# Patient Record
Sex: Female | Born: 1950 | Race: Black or African American | Hispanic: No | Smoking: Never smoker
Health system: Southern US, Community
[De-identification: ages and names within clinical notes are randomized; demographics above are authoritative.]

## PROBLEM LIST (undated history)

## (undated) DIAGNOSIS — I10 Essential (primary) hypertension: Secondary | ICD-10-CM

## (undated) DIAGNOSIS — E079 Disorder of thyroid, unspecified: Secondary | ICD-10-CM

## (undated) DIAGNOSIS — F32A Depression, unspecified: Secondary | ICD-10-CM

## (undated) DIAGNOSIS — F329 Major depressive disorder, single episode, unspecified: Secondary | ICD-10-CM

## (undated) HISTORY — PX: TONSILLECTOMY: SUR1361

---

## 1998-11-19 ENCOUNTER — Emergency Department (HOSPITAL_COMMUNITY): Admission: EM | Admit: 1998-11-19 | Discharge: 1998-11-19 | Payer: Self-pay | Admitting: Emergency Medicine

## 1998-11-29 ENCOUNTER — Emergency Department (HOSPITAL_COMMUNITY): Admission: EM | Admit: 1998-11-29 | Discharge: 1998-11-29 | Payer: Self-pay | Admitting: Emergency Medicine

## 1998-12-08 ENCOUNTER — Encounter: Admission: RE | Admit: 1998-12-08 | Discharge: 1999-03-08 | Payer: Self-pay | Admitting: *Deleted

## 1999-11-17 ENCOUNTER — Other Ambulatory Visit: Admission: RE | Admit: 1999-11-17 | Discharge: 1999-11-17 | Payer: Self-pay | Admitting: Obstetrics & Gynecology

## 1999-11-17 ENCOUNTER — Encounter (INDEPENDENT_AMBULATORY_CARE_PROVIDER_SITE_OTHER): Payer: Self-pay

## 2001-01-03 ENCOUNTER — Other Ambulatory Visit: Admission: RE | Admit: 2001-01-03 | Discharge: 2001-01-03 | Payer: Self-pay | Admitting: Obstetrics & Gynecology

## 2001-02-18 ENCOUNTER — Encounter: Admission: RE | Admit: 2001-02-18 | Discharge: 2001-02-18 | Payer: Self-pay | Admitting: Internal Medicine

## 2001-02-18 ENCOUNTER — Encounter: Payer: Self-pay | Admitting: Internal Medicine

## 2001-05-26 ENCOUNTER — Encounter: Payer: Self-pay | Admitting: Internal Medicine

## 2001-05-26 ENCOUNTER — Encounter: Admission: RE | Admit: 2001-05-26 | Discharge: 2001-05-26 | Payer: Self-pay | Admitting: Internal Medicine

## 2001-05-30 ENCOUNTER — Encounter: Payer: Self-pay | Admitting: Internal Medicine

## 2001-05-30 ENCOUNTER — Ambulatory Visit (HOSPITAL_COMMUNITY): Admission: RE | Admit: 2001-05-30 | Discharge: 2001-05-30 | Payer: Self-pay | Admitting: Internal Medicine

## 2001-06-02 ENCOUNTER — Encounter: Payer: Self-pay | Admitting: *Deleted

## 2001-06-02 ENCOUNTER — Ambulatory Visit (HOSPITAL_COMMUNITY): Admission: RE | Admit: 2001-06-02 | Discharge: 2001-06-02 | Payer: Self-pay | Admitting: *Deleted

## 2003-12-31 ENCOUNTER — Other Ambulatory Visit: Admission: RE | Admit: 2003-12-31 | Discharge: 2003-12-31 | Payer: Self-pay | Admitting: Obstetrics & Gynecology

## 2005-10-06 ENCOUNTER — Emergency Department (HOSPITAL_COMMUNITY): Admission: EM | Admit: 2005-10-06 | Discharge: 2005-10-06 | Payer: Self-pay | Admitting: Emergency Medicine

## 2005-10-08 ENCOUNTER — Ambulatory Visit (HOSPITAL_COMMUNITY): Admission: RE | Admit: 2005-10-08 | Discharge: 2005-10-08 | Payer: Self-pay | Admitting: Internal Medicine

## 2006-03-25 ENCOUNTER — Encounter: Admission: RE | Admit: 2006-03-25 | Discharge: 2006-03-25 | Payer: Self-pay | Admitting: Internal Medicine

## 2007-01-29 ENCOUNTER — Encounter: Admission: RE | Admit: 2007-01-29 | Discharge: 2007-01-29 | Payer: Self-pay | Admitting: Internal Medicine

## 2007-04-30 ENCOUNTER — Encounter: Admission: RE | Admit: 2007-04-30 | Discharge: 2007-07-09 | Payer: Self-pay | Admitting: Orthopedic Surgery

## 2007-07-30 ENCOUNTER — Emergency Department (HOSPITAL_COMMUNITY): Admission: EM | Admit: 2007-07-30 | Discharge: 2007-07-30 | Payer: Self-pay | Admitting: Emergency Medicine

## 2007-08-19 ENCOUNTER — Encounter: Admission: RE | Admit: 2007-08-19 | Discharge: 2007-10-01 | Payer: Self-pay | Admitting: Orthopedic Surgery

## 2007-11-12 ENCOUNTER — Ambulatory Visit (HOSPITAL_COMMUNITY): Admission: RE | Admit: 2007-11-12 | Discharge: 2007-11-12 | Payer: Self-pay | Admitting: Obstetrics & Gynecology

## 2007-11-12 ENCOUNTER — Encounter (INDEPENDENT_AMBULATORY_CARE_PROVIDER_SITE_OTHER): Payer: Self-pay | Admitting: Obstetrics & Gynecology

## 2008-07-09 ENCOUNTER — Encounter: Admission: RE | Admit: 2008-07-09 | Discharge: 2008-07-09 | Payer: Self-pay | Admitting: Internal Medicine

## 2010-05-23 ENCOUNTER — Emergency Department (HOSPITAL_COMMUNITY)
Admission: EM | Admit: 2010-05-23 | Discharge: 2010-05-24 | Payer: Self-pay | Source: Home / Self Care | Admitting: Emergency Medicine

## 2010-10-17 NOTE — Op Note (Signed)
NAME:  Shelby, Howell NO.:  [id number]   MEDICAL RECORD NO.:  [id number]          PATIENT TYPE:  AMB   LOCATION:  SDC                           FACILITY:  [facility]   PHYSICIAN:  Ilda Mori, M.D.   DATE OF BIRTH:  01/23/1951   DATE OF PROCEDURE:  11/12/2007  DATE OF DISCHARGE:                               OPERATIVE REPORT   PREOPERATIVE DIAGNOSES:  1. Postmenopausal bleeding.  2. Endometrial polyps.   POSTOPERATIVE DIAGNOSES:  1. Postmenopausal bleeding.  2. Endometrial polyps.   PROCEDURE:  1. Hysteroscopy.  2. Dilation and curettage.  3. Endometrial polypectomy.   SURGEON:  Duke Salvia. Arlyce Dice, MD   ANESTHESIA:  General.   ESTIMATED BLOOD LOSS:  Minimal.   FINDINGS:  Three 2-cm endometrial polyps.  The uterine cavity otherwise  appeared normal.   SPECIMENS:  Sent to pathology.   INDICATIONS:  This is a 60 year old female who has had several episodes  of postmenopausal bleeding.  Office endometrial aspiration revealed  benign tissue with fragments of endometrial polyps.  The patient had  further episodes of bleeding and a sonohysterogram confirmed the  presence of endometrial polyps.  This was discussed with the patient and  a decision was made to remove them in the operating room.   PROCEDURE:  The patient was taken to the operating room and general  anesthesia was induced.  She was placed in a dorsal lithotomy position  and the vagina and perineum were prepped and draped in sterile fashion.  The anterior lip of the cervix was grasped with a single-tooth tenaculum  and the internal os was dilated with Pratt dilators to a 23-French.  A  diagnostic hysteroscope was introduced in the endometrial cavity and the  polyps were identified.  The hysteroscope was then removed and using a  polyp forceps, the polyps were then removed.  The hysteroscope was  replaced and a small seedling myoma was noted.  The operative  hysteroscope was then introduced after  the cervix  was dilated to 29-French and a cautery was used to remove the seedling  myoma .  At this point, the cavity was thoroughly inspected and no  further polyps remained and the procedure was terminated.  The patient  tolerated the procedure well and left the operating room in good  condition.      Ilda Mori, M.D.  Electronically Signed     RK/MEDQ  D:  11/12/2007  T:  11/13/2007  Job:  [id number]

## 2011-03-01 LAB — CBC
HCT: 36
Platelets: 232
RDW: 14.6
WBC: 6.1

## 2011-03-01 LAB — BASIC METABOLIC PANEL
BUN: 9
GFR calc Af Amer: >60
GFR calc non Af Amer: >60
Potassium: 3.5

## 2011-04-12 ENCOUNTER — Ambulatory Visit: Payer: Medicare Other | Attending: Orthopedic Surgery | Admitting: Occupational Therapy

## 2011-04-12 DIAGNOSIS — IMO0001 Reserved for inherently not codable concepts without codable children: Secondary | ICD-10-CM | POA: Insufficient documentation

## 2011-04-12 DIAGNOSIS — M24549 Contracture, unspecified hand: Secondary | ICD-10-CM | POA: Insufficient documentation

## 2011-04-18 ENCOUNTER — Encounter: Payer: Medicare Other | Admitting: Occupational Therapy

## 2011-05-04 ENCOUNTER — Encounter: Payer: Medicare Other | Admitting: *Deleted

## 2011-07-10 ENCOUNTER — Ambulatory Visit: Payer: Medicare Other | Attending: Orthopedic Surgery | Admitting: Occupational Therapy

## 2011-07-10 DIAGNOSIS — M24549 Contracture, unspecified hand: Secondary | ICD-10-CM | POA: Insufficient documentation

## 2011-07-10 DIAGNOSIS — IMO0001 Reserved for inherently not codable concepts without codable children: Secondary | ICD-10-CM | POA: Insufficient documentation

## 2011-07-24 ENCOUNTER — Encounter: Payer: Medicare Other | Admitting: Occupational Therapy

## 2011-08-07 ENCOUNTER — Encounter: Payer: Medicare Other | Admitting: Occupational Therapy

## 2012-12-29 ENCOUNTER — Other Ambulatory Visit: Payer: Self-pay

## 2014-11-19 ENCOUNTER — Emergency Department (HOSPITAL_COMMUNITY)
Admission: EM | Admit: 2014-11-19 | Discharge: 2014-11-19 | Disposition: A | Discharge status: Home / Self Care | Payer: Medicare Other | Attending: Emergency Medicine | Admitting: Emergency Medicine

## 2014-11-19 ENCOUNTER — Encounter (HOSPITAL_COMMUNITY): Payer: Self-pay | Admitting: Family Medicine

## 2014-11-19 DIAGNOSIS — R0981 Nasal congestion: Secondary | ICD-10-CM | POA: Diagnosis not present

## 2014-11-19 DIAGNOSIS — Z880 Allergy status to penicillin: Secondary | ICD-10-CM | POA: Insufficient documentation

## 2014-11-19 DIAGNOSIS — F419 Anxiety disorder, unspecified: Secondary | ICD-10-CM | POA: Diagnosis not present

## 2014-11-19 DIAGNOSIS — I10 Essential (primary) hypertension: Secondary | ICD-10-CM

## 2014-11-19 DIAGNOSIS — Z8639 Personal history of other endocrine, nutritional and metabolic disease: Secondary | ICD-10-CM | POA: Insufficient documentation

## 2014-11-19 DIAGNOSIS — R110 Nausea: Secondary | ICD-10-CM | POA: Insufficient documentation

## 2014-11-19 DIAGNOSIS — R42 Dizziness and giddiness: Secondary | ICD-10-CM | POA: Diagnosis present

## 2014-11-19 HISTORY — DX: Disorder of thyroid, unspecified: E07.9

## 2014-11-19 HISTORY — DX: Depression, unspecified: F32.A

## 2014-11-19 HISTORY — DX: Major depressive disorder, single episode, unspecified: F32.9

## 2014-11-19 HISTORY — DX: Essential (primary) hypertension: I10

## 2014-11-19 LAB — BASIC METABOLIC PANEL
Anion gap: 7 (ref 5–15)
BUN: 8 mg/dL (ref 6–20)
CO2: 28 mmol/L (ref 22–32)
CREATININE: 0.76 mg/dL (ref 0.44–1.00)
Calcium: 10.1 mg/dL (ref 8.9–10.3)
Chloride: 104 mmol/L (ref 101–111)
GFR calc Af Amer: >60 mL/min (ref >60)
GLUCOSE: 95 mg/dL (ref 65–99)
POTASSIUM: 3.8 mmol/L (ref 3.5–5.1)
Sodium: 139 mmol/L (ref 135–145)

## 2014-11-19 LAB — CBC
HEMATOCRIT: 37.5 % (ref 36.0–46.0)
Hemoglobin: 12.2 g/dL (ref 12.0–15.0)
MCH: 26.8 pg (ref 26.0–34.0)
MCHC: 32.5 g/dL (ref 30.0–36.0)
MCV: 82.2 fL (ref 78.0–100.0)
PLATELETS: 239 10*3/uL (ref 150–400)
RBC: 4.56 MIL/uL (ref 3.87–5.11)
RDW: 15.1 % (ref 11.5–15.5)
WBC: 7 10*3/uL (ref 4.0–10.5)

## 2014-11-19 LAB — I-STAT TROPONIN, ED: TROPONIN I, POC: 0 ng/mL (ref 0.00–0.08)

## 2014-11-19 MED ORDER — FLUTICASONE PROPIONATE 50 MCG/ACT NA SUSP: 2.0000 | Freq: Every day | NASAL | Status: AC

## 2014-11-19 NOTE — Discharge Instructions (Signed)
Take flonase daily.   Take your atenolol as prescribed.   See your doctor.   Return to ER if you have worse dizziness, chest pain, abdominal pain.

## 2014-11-19 NOTE — ED Provider Notes (Signed)
CSN: [id number]     Arrival date & time 11/19/14  1823 History   First MD Initiated Contact with Patient 11/19/14 2022     Chief Complaint  Patient presents with  . Dizziness  . Nausea     (Consider location/radiation/quality/duration/timing/severity/associated sxs/prior Treatment) The history is provided by the patient.  Shelby Howell is a 64 y.o. female hx of HTN, depression, here presenting with nausea, dizziness. States that she has been having some nasal congestion. She is also taking care of her father who has similar symptoms. Denies any chest pain or shortness of breath or abdominal pain or vomiting or fevers. She states that her blood pressure has been elevated recently but she is taking her atenolol. Also feels dizzy. Denies passing out.    Past Medical History  Diagnosis Date  . Hypertension   . Depression   . Thyroid disease    Past Surgical History  Procedure Laterality Date  . Tonsillectomy     History reviewed. No pertinent family history. History  Substance Use Topics  . Smoking status: Never Smoker   . Smokeless tobacco: Not on file  . Alcohol Use: No   OB History    No data available     Review of Systems  Neurological: Positive for dizziness.  All other systems reviewed and are negative.     Allergies  Penicillins  Home Medications   Prior to Admission medications   Not on File   BP 159/85 mmHg  Pulse 61  Temp(Src) 98.4 F (36.9 C) (Oral)  Resp 17  SpO2 100% Physical Exam  Constitutional: She is oriented to person, place, and time.  Slightly anxious   HENT:  Head: Normocephalic.  Mouth/Throat: Oropharynx is clear and moist.  Eyes: Conjunctivae are normal. Pupils are equal, round, and reactive to light.  Neck: Normal range of motion. Neck supple.  Cardiovascular: Normal rate and normal heart sounds.   Pulmonary/Chest: Effort normal and breath sounds normal. No respiratory distress. She has no wheezes. She has no rales.  Abdominal:  Soft. Bowel sounds are normal. She exhibits no distension. There is no tenderness. There is no rebound.  Musculoskeletal: Normal range of motion. She exhibits no edema or tenderness.  Neurological: She is alert and oriented to person, place, and time. No cranial nerve deficit. Coordination normal.  Skin: Skin is warm and dry.  Psychiatric: She has a normal mood and affect. Her behavior is normal. Judgment and thought content normal.  Nursing note and vitals reviewed.   ED Course  Procedures (including critical care time) Labs Review Labs Reviewed  [place], ED    Imaging Review No results found.   EKG Interpretation   Date/Time:  Friday November 19 2014 18:31:41 EDT Ventricular Rate:  59 PR Interval:  164 QRS Duration: 80 QT Interval:  426 QTC Calculation: 421 R Axis:   31 Text Interpretation:  Sinus bradycardia Otherwise normal ECG No previous  ECGs available Confirmed by Wray Goehring  MD, Moises Terpstra ([id number]) on 11/19/2014 8:22:47 PM      MDM   Final diagnoses:  None   Shelby Howell is a 64 y.o. female here with dizziness, nasal congestion. Well appearing. BP 160s initially but dec to 140/80 while in the ED. No signs of hypertensive emergency. Will give flonase for nasal congestion. Stable for dc.     Wandra Arthurs, MD 11/19/14 2104

## 2014-11-19 NOTE — ED Notes (Signed)
Pt here for nausea, dizziness that started yesterday. sts BP has been elevated and some nasal drainage. Denies pain.

## 2014-11-29 DIAGNOSIS — I10 Essential (primary) hypertension: Secondary | ICD-10-CM | POA: Diagnosis not present

## 2014-11-29 DIAGNOSIS — E039 Hypothyroidism, unspecified: Secondary | ICD-10-CM | POA: Diagnosis not present

## 2014-11-29 DIAGNOSIS — E782 Mixed hyperlipidemia: Secondary | ICD-10-CM | POA: Diagnosis not present

## 2014-11-29 DIAGNOSIS — F209 Schizophrenia, unspecified: Secondary | ICD-10-CM | POA: Diagnosis not present

## 2015-02-28 DIAGNOSIS — I10 Essential (primary) hypertension: Secondary | ICD-10-CM | POA: Diagnosis not present

## 2015-03-15 DIAGNOSIS — H1045 Other chronic allergic conjunctivitis: Secondary | ICD-10-CM | POA: Diagnosis not present

## 2015-03-15 DIAGNOSIS — J3089 Other allergic rhinitis: Secondary | ICD-10-CM | POA: Diagnosis not present

## 2015-03-15 DIAGNOSIS — J3081 Allergic rhinitis due to animal (cat) (dog) hair and dander: Secondary | ICD-10-CM | POA: Diagnosis not present

## 2015-03-15 DIAGNOSIS — L509 Urticaria, unspecified: Secondary | ICD-10-CM | POA: Diagnosis not present

## 2015-03-15 DIAGNOSIS — Z23 Encounter for immunization: Secondary | ICD-10-CM | POA: Diagnosis not present

## 2015-05-17 DIAGNOSIS — H04123 Dry eye syndrome of bilateral lacrimal glands: Secondary | ICD-10-CM | POA: Diagnosis not present

## 2015-05-17 DIAGNOSIS — H25013 Cortical age-related cataract, bilateral: Secondary | ICD-10-CM | POA: Diagnosis not present

## 2015-05-17 DIAGNOSIS — H2513 Age-related nuclear cataract, bilateral: Secondary | ICD-10-CM | POA: Diagnosis not present

## 2015-05-17 DIAGNOSIS — H40023 Open angle with borderline findings, high risk, bilateral: Secondary | ICD-10-CM | POA: Diagnosis not present

## 2015-06-02 DIAGNOSIS — Z1389 Encounter for screening for other disorder: Secondary | ICD-10-CM | POA: Diagnosis not present

## 2015-06-02 DIAGNOSIS — Z0000 Encounter for general adult medical examination without abnormal findings: Secondary | ICD-10-CM | POA: Diagnosis not present

## 2015-06-02 DIAGNOSIS — E782 Mixed hyperlipidemia: Secondary | ICD-10-CM | POA: Diagnosis not present

## 2015-06-02 DIAGNOSIS — J309 Allergic rhinitis, unspecified: Secondary | ICD-10-CM | POA: Diagnosis not present

## 2015-06-02 DIAGNOSIS — F209 Schizophrenia, unspecified: Secondary | ICD-10-CM | POA: Diagnosis not present

## 2015-06-02 DIAGNOSIS — E039 Hypothyroidism, unspecified: Secondary | ICD-10-CM | POA: Diagnosis not present

## 2015-06-02 DIAGNOSIS — R7309 Other abnormal glucose: Secondary | ICD-10-CM | POA: Diagnosis not present

## 2015-06-02 DIAGNOSIS — Z23 Encounter for immunization: Secondary | ICD-10-CM | POA: Diagnosis not present

## 2015-06-02 DIAGNOSIS — I10 Essential (primary) hypertension: Secondary | ICD-10-CM | POA: Diagnosis not present

## 2015-08-15 DIAGNOSIS — Z803 Family history of malignant neoplasm of breast: Secondary | ICD-10-CM | POA: Diagnosis not present

## 2015-08-15 DIAGNOSIS — Z1231 Encounter for screening mammogram for malignant neoplasm of breast: Secondary | ICD-10-CM | POA: Diagnosis not present

## 2015-08-19 DIAGNOSIS — J309 Allergic rhinitis, unspecified: Secondary | ICD-10-CM | POA: Diagnosis not present

## 2015-08-19 DIAGNOSIS — R42 Dizziness and giddiness: Secondary | ICD-10-CM | POA: Diagnosis not present

## 2015-08-19 DIAGNOSIS — R350 Frequency of micturition: Secondary | ICD-10-CM | POA: Diagnosis not present

## 2015-09-30 ENCOUNTER — Ambulatory Visit
Admission: RE | Admit: 2015-09-30 | Discharge: 2015-09-30 | Disposition: A | Discharge status: Home / Self Care | Payer: Medicare Other | Source: Ambulatory Visit | Attending: Internal Medicine | Admitting: Internal Medicine

## 2015-09-30 ENCOUNTER — Other Ambulatory Visit: Payer: Self-pay | Admitting: Internal Medicine

## 2015-09-30 DIAGNOSIS — R1084 Generalized abdominal pain: Secondary | ICD-10-CM | POA: Diagnosis not present

## 2015-09-30 DIAGNOSIS — R197 Diarrhea, unspecified: Secondary | ICD-10-CM | POA: Diagnosis not present

## 2015-09-30 DIAGNOSIS — R112 Nausea with vomiting, unspecified: Secondary | ICD-10-CM

## 2015-10-04 ENCOUNTER — Other Ambulatory Visit: Payer: 59

## 2015-10-12 DIAGNOSIS — R112 Nausea with vomiting, unspecified: Secondary | ICD-10-CM | POA: Diagnosis not present

## 2015-10-12 DIAGNOSIS — R197 Diarrhea, unspecified: Secondary | ICD-10-CM | POA: Diagnosis not present

## 2015-10-26 ENCOUNTER — Other Ambulatory Visit: Payer: Self-pay | Admitting: Gastroenterology

## 2015-10-26 DIAGNOSIS — R110 Nausea: Secondary | ICD-10-CM

## 2015-11-01 ENCOUNTER — Ambulatory Visit
Admission: RE | Admit: 2015-11-01 | Discharge: 2015-11-01 | Disposition: A | Discharge status: Home / Self Care | Payer: Medicare Other | Source: Ambulatory Visit | Attending: Gastroenterology | Admitting: Gastroenterology

## 2015-11-01 ENCOUNTER — Other Ambulatory Visit: Payer: Self-pay | Admitting: Gastroenterology

## 2015-11-01 DIAGNOSIS — R110 Nausea: Secondary | ICD-10-CM

## 2015-11-07 ENCOUNTER — Ambulatory Visit
Admission: RE | Admit: 2015-11-07 | Discharge: 2015-11-07 | Disposition: A | Discharge status: Home / Self Care | Payer: Medicare Other | Source: Ambulatory Visit | Attending: Gastroenterology | Admitting: Gastroenterology

## 2015-11-07 DIAGNOSIS — R110 Nausea: Secondary | ICD-10-CM

## 2015-11-07 DIAGNOSIS — K449 Diaphragmatic hernia without obstruction or gangrene: Secondary | ICD-10-CM | POA: Diagnosis not present

## 2015-12-20 DIAGNOSIS — I10 Essential (primary) hypertension: Secondary | ICD-10-CM | POA: Diagnosis not present

## 2015-12-20 DIAGNOSIS — R7303 Prediabetes: Secondary | ICD-10-CM | POA: Diagnosis not present

## 2015-12-20 DIAGNOSIS — E039 Hypothyroidism, unspecified: Secondary | ICD-10-CM | POA: Diagnosis not present

## 2015-12-20 DIAGNOSIS — F209 Schizophrenia, unspecified: Secondary | ICD-10-CM | POA: Diagnosis not present

## 2015-12-20 DIAGNOSIS — K219 Gastro-esophageal reflux disease without esophagitis: Secondary | ICD-10-CM | POA: Diagnosis not present

## 2015-12-20 DIAGNOSIS — E782 Mixed hyperlipidemia: Secondary | ICD-10-CM | POA: Diagnosis not present

## 2016-02-08 ENCOUNTER — Other Ambulatory Visit: Payer: Self-pay | Admitting: Obstetrics & Gynecology

## 2016-02-08 DIAGNOSIS — Z124 Encounter for screening for malignant neoplasm of cervix: Secondary | ICD-10-CM | POA: Diagnosis not present

## 2016-02-09 LAB — CYTOLOGY - PAP

## 2016-02-21 DIAGNOSIS — Z23 Encounter for immunization: Secondary | ICD-10-CM | POA: Diagnosis not present

## 2016-03-23 DIAGNOSIS — J3081 Allergic rhinitis due to animal (cat) (dog) hair and dander: Secondary | ICD-10-CM | POA: Diagnosis not present

## 2016-03-23 DIAGNOSIS — J3089 Other allergic rhinitis: Secondary | ICD-10-CM | POA: Diagnosis not present

## 2016-03-23 DIAGNOSIS — H1045 Other chronic allergic conjunctivitis: Secondary | ICD-10-CM | POA: Diagnosis not present

## 2016-03-23 DIAGNOSIS — L509 Urticaria, unspecified: Secondary | ICD-10-CM | POA: Diagnosis not present

## 2016-03-27 DIAGNOSIS — R42 Dizziness and giddiness: Secondary | ICD-10-CM | POA: Diagnosis not present

## 2016-06-11 DIAGNOSIS — H2513 Age-related nuclear cataract, bilateral: Secondary | ICD-10-CM | POA: Diagnosis not present

## 2016-06-11 DIAGNOSIS — H25013 Cortical age-related cataract, bilateral: Secondary | ICD-10-CM | POA: Diagnosis not present

## 2016-06-11 DIAGNOSIS — H04123 Dry eye syndrome of bilateral lacrimal glands: Secondary | ICD-10-CM | POA: Diagnosis not present

## 2016-06-11 DIAGNOSIS — H40023 Open angle with borderline findings, high risk, bilateral: Secondary | ICD-10-CM | POA: Diagnosis not present

## 2016-06-26 DIAGNOSIS — R42 Dizziness and giddiness: Secondary | ICD-10-CM | POA: Diagnosis not present

## 2016-06-26 DIAGNOSIS — H6120 Impacted cerumen, unspecified ear: Secondary | ICD-10-CM | POA: Diagnosis not present

## 2016-06-26 DIAGNOSIS — H8110 Benign paroxysmal vertigo, unspecified ear: Secondary | ICD-10-CM | POA: Diagnosis not present

## 2016-06-29 ENCOUNTER — Other Ambulatory Visit: Payer: Self-pay | Admitting: Internal Medicine

## 2016-06-29 DIAGNOSIS — R42 Dizziness and giddiness: Secondary | ICD-10-CM

## 2016-07-02 ENCOUNTER — Ambulatory Visit
Admission: RE | Admit: 2016-07-02 | Discharge: 2016-07-02 | Disposition: A | Discharge status: Home / Self Care | Payer: Medicare Other | Source: Ambulatory Visit | Attending: Internal Medicine | Admitting: Internal Medicine

## 2016-07-02 DIAGNOSIS — H6123 Impacted cerumen, bilateral: Secondary | ICD-10-CM | POA: Diagnosis not present

## 2016-07-02 DIAGNOSIS — R51 Headache: Secondary | ICD-10-CM | POA: Diagnosis not present

## 2016-07-02 DIAGNOSIS — R42 Dizziness and giddiness: Secondary | ICD-10-CM

## 2016-07-04 DIAGNOSIS — Z0000 Encounter for general adult medical examination without abnormal findings: Secondary | ICD-10-CM | POA: Diagnosis not present

## 2016-07-04 DIAGNOSIS — K219 Gastro-esophageal reflux disease without esophagitis: Secondary | ICD-10-CM | POA: Diagnosis not present

## 2016-07-04 DIAGNOSIS — Z1389 Encounter for screening for other disorder: Secondary | ICD-10-CM | POA: Diagnosis not present

## 2016-07-04 DIAGNOSIS — F209 Schizophrenia, unspecified: Secondary | ICD-10-CM | POA: Diagnosis not present

## 2016-07-04 DIAGNOSIS — J309 Allergic rhinitis, unspecified: Secondary | ICD-10-CM | POA: Diagnosis not present

## 2016-07-04 DIAGNOSIS — Z7189 Other specified counseling: Secondary | ICD-10-CM | POA: Diagnosis not present

## 2016-07-04 DIAGNOSIS — E039 Hypothyroidism, unspecified: Secondary | ICD-10-CM | POA: Diagnosis not present

## 2016-07-04 DIAGNOSIS — Z1211 Encounter for screening for malignant neoplasm of colon: Secondary | ICD-10-CM | POA: Diagnosis not present

## 2016-07-04 DIAGNOSIS — E782 Mixed hyperlipidemia: Secondary | ICD-10-CM | POA: Diagnosis not present

## 2016-07-04 DIAGNOSIS — M5090 Cervical disc disorder, unspecified, unspecified cervical region: Secondary | ICD-10-CM | POA: Diagnosis not present

## 2016-07-04 DIAGNOSIS — R7303 Prediabetes: Secondary | ICD-10-CM | POA: Diagnosis not present

## 2016-07-04 DIAGNOSIS — I10 Essential (primary) hypertension: Secondary | ICD-10-CM | POA: Diagnosis not present

## 2016-08-06 DIAGNOSIS — M810 Age-related osteoporosis without current pathological fracture: Secondary | ICD-10-CM | POA: Diagnosis not present

## 2016-08-20 DIAGNOSIS — Z1231 Encounter for screening mammogram for malignant neoplasm of breast: Secondary | ICD-10-CM | POA: Diagnosis not present

## 2016-08-20 DIAGNOSIS — Z803 Family history of malignant neoplasm of breast: Secondary | ICD-10-CM | POA: Diagnosis not present

## 2016-08-27 DIAGNOSIS — R197 Diarrhea, unspecified: Secondary | ICD-10-CM | POA: Diagnosis not present

## 2016-08-27 DIAGNOSIS — K219 Gastro-esophageal reflux disease without esophagitis: Secondary | ICD-10-CM | POA: Diagnosis not present

## 2016-08-27 DIAGNOSIS — R112 Nausea with vomiting, unspecified: Secondary | ICD-10-CM | POA: Diagnosis not present

## 2016-08-29 ENCOUNTER — Other Ambulatory Visit: Payer: Self-pay | Admitting: Gastroenterology

## 2016-11-05 ENCOUNTER — Encounter (HOSPITAL_COMMUNITY): Payer: Self-pay

## 2016-11-05 ENCOUNTER — Ambulatory Visit (HOSPITAL_COMMUNITY): Admit: 2016-11-05 | Payer: Medicare Other | Admitting: Gastroenterology

## 2016-11-05 SURGERY — COLONOSCOPY WITH PROPOFOL
Anesthesia: Monitor Anesthesia Care

## 2016-12-17 DIAGNOSIS — H40023 Open angle with borderline findings, high risk, bilateral: Secondary | ICD-10-CM | POA: Diagnosis not present

## 2016-12-17 DIAGNOSIS — H04123 Dry eye syndrome of bilateral lacrimal glands: Secondary | ICD-10-CM | POA: Diagnosis not present

## 2017-01-01 ENCOUNTER — Other Ambulatory Visit: Payer: Self-pay | Admitting: Gastroenterology

## 2017-01-01 DIAGNOSIS — F209 Schizophrenia, unspecified: Secondary | ICD-10-CM | POA: Diagnosis not present

## 2017-01-01 DIAGNOSIS — M5090 Cervical disc disorder, unspecified, unspecified cervical region: Secondary | ICD-10-CM | POA: Diagnosis not present

## 2017-01-01 DIAGNOSIS — E039 Hypothyroidism, unspecified: Secondary | ICD-10-CM | POA: Diagnosis not present

## 2017-01-01 DIAGNOSIS — Z800 Family history of malignant neoplasm of digestive organs: Secondary | ICD-10-CM

## 2017-01-01 DIAGNOSIS — R7303 Prediabetes: Secondary | ICD-10-CM | POA: Diagnosis not present

## 2017-01-01 DIAGNOSIS — E782 Mixed hyperlipidemia: Secondary | ICD-10-CM | POA: Diagnosis not present

## 2017-01-01 DIAGNOSIS — K219 Gastro-esophageal reflux disease without esophagitis: Secondary | ICD-10-CM | POA: Diagnosis not present

## 2017-01-01 DIAGNOSIS — I10 Essential (primary) hypertension: Secondary | ICD-10-CM | POA: Diagnosis not present

## 2017-01-17 ENCOUNTER — Ambulatory Visit
Admission: RE | Admit: 2017-01-17 | Discharge: 2017-01-17 | Disposition: A | Discharge status: Home / Self Care | Payer: Medicare Other | Source: Ambulatory Visit | Attending: Gastroenterology | Admitting: Gastroenterology

## 2017-01-17 DIAGNOSIS — R932 Abnormal findings on diagnostic imaging of liver and biliary tract: Secondary | ICD-10-CM | POA: Diagnosis not present

## 2017-01-17 DIAGNOSIS — N281 Cyst of kidney, acquired: Secondary | ICD-10-CM | POA: Diagnosis not present

## 2017-01-17 DIAGNOSIS — Z800 Family history of malignant neoplasm of digestive organs: Secondary | ICD-10-CM

## 2017-01-17 MED ORDER — GADOBENATE DIMEGLUMINE 529 MG/ML IV SOLN
15.0000 mL | Freq: Once | INTRAVENOUS | Status: AC | PRN
  Administered 2017-01-17: 15 mL via INTRAVENOUS

## 2017-01-24 DIAGNOSIS — F250 Schizoaffective disorder, bipolar type: Secondary | ICD-10-CM | POA: Diagnosis not present

## 2017-01-31 DIAGNOSIS — K6389 Other specified diseases of intestine: Secondary | ICD-10-CM | POA: Diagnosis not present

## 2017-01-31 DIAGNOSIS — Q438 Other specified congenital malformations of intestine: Secondary | ICD-10-CM | POA: Diagnosis not present

## 2017-01-31 DIAGNOSIS — Z1211 Encounter for screening for malignant neoplasm of colon: Secondary | ICD-10-CM | POA: Diagnosis not present

## 2017-01-31 DIAGNOSIS — K5730 Diverticulosis of large intestine without perforation or abscess without bleeding: Secondary | ICD-10-CM | POA: Diagnosis not present

## 2017-01-31 DIAGNOSIS — K648 Other hemorrhoids: Secondary | ICD-10-CM | POA: Diagnosis not present

## 2017-01-31 DIAGNOSIS — K6289 Other specified diseases of anus and rectum: Secondary | ICD-10-CM | POA: Diagnosis not present

## 2017-02-05 DIAGNOSIS — K6389 Other specified diseases of intestine: Secondary | ICD-10-CM | POA: Diagnosis not present

## 2017-02-05 DIAGNOSIS — K6289 Other specified diseases of anus and rectum: Secondary | ICD-10-CM | POA: Diagnosis not present

## 2017-02-05 DIAGNOSIS — Z1211 Encounter for screening for malignant neoplasm of colon: Secondary | ICD-10-CM | POA: Diagnosis not present

## 2017-03-19 DIAGNOSIS — J3081 Allergic rhinitis due to animal (cat) (dog) hair and dander: Secondary | ICD-10-CM | POA: Diagnosis not present

## 2017-03-19 DIAGNOSIS — H1045 Other chronic allergic conjunctivitis: Secondary | ICD-10-CM | POA: Diagnosis not present

## 2017-03-19 DIAGNOSIS — J3089 Other allergic rhinitis: Secondary | ICD-10-CM | POA: Diagnosis not present

## 2017-03-19 DIAGNOSIS — L509 Urticaria, unspecified: Secondary | ICD-10-CM | POA: Diagnosis not present

## 2017-03-20 DIAGNOSIS — Z23 Encounter for immunization: Secondary | ICD-10-CM | POA: Diagnosis not present

## 2017-04-03 DIAGNOSIS — E039 Hypothyroidism, unspecified: Secondary | ICD-10-CM | POA: Diagnosis not present

## 2017-04-03 DIAGNOSIS — E782 Mixed hyperlipidemia: Secondary | ICD-10-CM | POA: Diagnosis not present

## 2017-04-03 DIAGNOSIS — I10 Essential (primary) hypertension: Secondary | ICD-10-CM | POA: Diagnosis not present

## 2017-04-08 DIAGNOSIS — M779 Enthesopathy, unspecified: Secondary | ICD-10-CM | POA: Diagnosis not present

## 2017-07-02 DIAGNOSIS — H25013 Cortical age-related cataract, bilateral: Secondary | ICD-10-CM | POA: Diagnosis not present

## 2017-07-02 DIAGNOSIS — H2513 Age-related nuclear cataract, bilateral: Secondary | ICD-10-CM | POA: Diagnosis not present

## 2017-07-02 DIAGNOSIS — H40023 Open angle with borderline findings, high risk, bilateral: Secondary | ICD-10-CM | POA: Diagnosis not present

## 2017-07-02 DIAGNOSIS — H04123 Dry eye syndrome of bilateral lacrimal glands: Secondary | ICD-10-CM | POA: Diagnosis not present

## 2017-08-01 DIAGNOSIS — E039 Hypothyroidism, unspecified: Secondary | ICD-10-CM | POA: Diagnosis not present

## 2017-08-01 DIAGNOSIS — I10 Essential (primary) hypertension: Secondary | ICD-10-CM | POA: Diagnosis not present

## 2017-08-01 DIAGNOSIS — E782 Mixed hyperlipidemia: Secondary | ICD-10-CM | POA: Diagnosis not present

## 2017-08-21 DIAGNOSIS — K219 Gastro-esophageal reflux disease without esophagitis: Secondary | ICD-10-CM | POA: Diagnosis not present

## 2017-08-21 DIAGNOSIS — Z1231 Encounter for screening mammogram for malignant neoplasm of breast: Secondary | ICD-10-CM | POA: Diagnosis not present

## 2017-08-21 DIAGNOSIS — Z800 Family history of malignant neoplasm of digestive organs: Secondary | ICD-10-CM | POA: Diagnosis not present

## 2017-08-21 DIAGNOSIS — Z803 Family history of malignant neoplasm of breast: Secondary | ICD-10-CM | POA: Diagnosis not present

## 2017-08-26 DIAGNOSIS — R739 Hyperglycemia, unspecified: Secondary | ICD-10-CM | POA: Diagnosis not present

## 2017-08-26 DIAGNOSIS — M5090 Cervical disc disorder, unspecified, unspecified cervical region: Secondary | ICD-10-CM | POA: Diagnosis not present

## 2017-08-26 DIAGNOSIS — Z1389 Encounter for screening for other disorder: Secondary | ICD-10-CM | POA: Diagnosis not present

## 2017-08-26 DIAGNOSIS — Z6828 Body mass index (BMI) 28.0-28.9, adult: Secondary | ICD-10-CM | POA: Diagnosis not present

## 2017-08-26 DIAGNOSIS — E782 Mixed hyperlipidemia: Secondary | ICD-10-CM | POA: Diagnosis not present

## 2017-08-26 DIAGNOSIS — E039 Hypothyroidism, unspecified: Secondary | ICD-10-CM | POA: Diagnosis not present

## 2017-08-26 DIAGNOSIS — I10 Essential (primary) hypertension: Secondary | ICD-10-CM | POA: Diagnosis not present

## 2017-08-26 DIAGNOSIS — F209 Schizophrenia, unspecified: Secondary | ICD-10-CM | POA: Diagnosis not present

## 2017-08-26 DIAGNOSIS — K219 Gastro-esophageal reflux disease without esophagitis: Secondary | ICD-10-CM | POA: Diagnosis not present

## 2017-08-26 DIAGNOSIS — Z0000 Encounter for general adult medical examination without abnormal findings: Secondary | ICD-10-CM | POA: Diagnosis not present

## 2017-08-26 DIAGNOSIS — J309 Allergic rhinitis, unspecified: Secondary | ICD-10-CM | POA: Diagnosis not present

## 2017-10-01 DIAGNOSIS — I10 Essential (primary) hypertension: Secondary | ICD-10-CM | POA: Diagnosis not present

## 2017-10-01 DIAGNOSIS — E039 Hypothyroidism, unspecified: Secondary | ICD-10-CM | POA: Diagnosis not present

## 2017-10-01 DIAGNOSIS — E782 Mixed hyperlipidemia: Secondary | ICD-10-CM | POA: Diagnosis not present

## 2017-12-24 DIAGNOSIS — E782 Mixed hyperlipidemia: Secondary | ICD-10-CM | POA: Diagnosis not present

## 2017-12-24 DIAGNOSIS — I10 Essential (primary) hypertension: Secondary | ICD-10-CM | POA: Diagnosis not present

## 2017-12-24 DIAGNOSIS — E039 Hypothyroidism, unspecified: Secondary | ICD-10-CM | POA: Diagnosis not present

## 2018-01-08 DIAGNOSIS — I10 Essential (primary) hypertension: Secondary | ICD-10-CM | POA: Diagnosis not present

## 2018-01-08 DIAGNOSIS — E039 Hypothyroidism, unspecified: Secondary | ICD-10-CM | POA: Diagnosis not present

## 2018-01-08 DIAGNOSIS — E782 Mixed hyperlipidemia: Secondary | ICD-10-CM | POA: Diagnosis not present

## 2018-01-18 ENCOUNTER — Other Ambulatory Visit: Payer: Self-pay | Admitting: Gastroenterology

## 2018-01-18 DIAGNOSIS — Z800 Family history of malignant neoplasm of digestive organs: Secondary | ICD-10-CM

## 2018-02-04 ENCOUNTER — Ambulatory Visit
Admission: RE | Admit: 2018-02-04 | Discharge: 2018-02-04 | Disposition: A | Discharge status: Home / Self Care | Payer: Medicare Other | Source: Ambulatory Visit | Attending: Gastroenterology | Admitting: Gastroenterology

## 2018-02-04 DIAGNOSIS — Z800 Family history of malignant neoplasm of digestive organs: Secondary | ICD-10-CM

## 2018-02-04 DIAGNOSIS — D1803 Hemangioma of intra-abdominal structures: Secondary | ICD-10-CM | POA: Diagnosis not present

## 2018-02-04 DIAGNOSIS — R932 Abnormal findings on diagnostic imaging of liver and biliary tract: Secondary | ICD-10-CM | POA: Diagnosis not present

## 2018-02-04 MED ORDER — GADOBENATE DIMEGLUMINE 529 MG/ML IV SOLN
15.0000 mL | Freq: Once | INTRAVENOUS | Status: AC | PRN
  Administered 2018-02-04: 15 mL via INTRAVENOUS

## 2018-02-19 DIAGNOSIS — Z01419 Encounter for gynecological examination (general) (routine) without abnormal findings: Secondary | ICD-10-CM | POA: Diagnosis not present

## 2018-02-24 DIAGNOSIS — E782 Mixed hyperlipidemia: Secondary | ICD-10-CM | POA: Diagnosis not present

## 2018-02-24 DIAGNOSIS — F209 Schizophrenia, unspecified: Secondary | ICD-10-CM | POA: Diagnosis not present

## 2018-02-24 DIAGNOSIS — J309 Allergic rhinitis, unspecified: Secondary | ICD-10-CM | POA: Diagnosis not present

## 2018-02-24 DIAGNOSIS — K219 Gastro-esophageal reflux disease without esophagitis: Secondary | ICD-10-CM | POA: Diagnosis not present

## 2018-02-24 DIAGNOSIS — E039 Hypothyroidism, unspecified: Secondary | ICD-10-CM | POA: Diagnosis not present

## 2018-02-24 DIAGNOSIS — I10 Essential (primary) hypertension: Secondary | ICD-10-CM | POA: Diagnosis not present

## 2018-02-24 DIAGNOSIS — Z23 Encounter for immunization: Secondary | ICD-10-CM | POA: Diagnosis not present

## 2018-02-24 DIAGNOSIS — R7303 Prediabetes: Secondary | ICD-10-CM | POA: Diagnosis not present

## 2018-03-18 DIAGNOSIS — H1045 Other chronic allergic conjunctivitis: Secondary | ICD-10-CM | POA: Diagnosis not present

## 2018-03-18 DIAGNOSIS — J3081 Allergic rhinitis due to animal (cat) (dog) hair and dander: Secondary | ICD-10-CM | POA: Diagnosis not present

## 2018-03-18 DIAGNOSIS — J3089 Other allergic rhinitis: Secondary | ICD-10-CM | POA: Diagnosis not present

## 2018-03-18 DIAGNOSIS — L509 Urticaria, unspecified: Secondary | ICD-10-CM | POA: Diagnosis not present

## 2018-04-02 DIAGNOSIS — F250 Schizoaffective disorder, bipolar type: Secondary | ICD-10-CM | POA: Diagnosis not present

## 2018-06-02 DIAGNOSIS — N762 Acute vulvitis: Secondary | ICD-10-CM | POA: Diagnosis not present

## 2018-07-08 DIAGNOSIS — H04123 Dry eye syndrome of bilateral lacrimal glands: Secondary | ICD-10-CM | POA: Diagnosis not present

## 2018-07-08 DIAGNOSIS — H25013 Cortical age-related cataract, bilateral: Secondary | ICD-10-CM | POA: Diagnosis not present

## 2018-07-08 DIAGNOSIS — H40023 Open angle with borderline findings, high risk, bilateral: Secondary | ICD-10-CM | POA: Diagnosis not present

## 2018-07-08 DIAGNOSIS — H2513 Age-related nuclear cataract, bilateral: Secondary | ICD-10-CM | POA: Diagnosis not present

## 2018-09-03 DIAGNOSIS — K219 Gastro-esophageal reflux disease without esophagitis: Secondary | ICD-10-CM | POA: Diagnosis not present

## 2018-09-03 DIAGNOSIS — R7303 Prediabetes: Secondary | ICD-10-CM | POA: Diagnosis not present

## 2018-09-03 DIAGNOSIS — Z0000 Encounter for general adult medical examination without abnormal findings: Secondary | ICD-10-CM | POA: Diagnosis not present

## 2018-09-03 DIAGNOSIS — F209 Schizophrenia, unspecified: Secondary | ICD-10-CM | POA: Diagnosis not present

## 2018-09-03 DIAGNOSIS — E039 Hypothyroidism, unspecified: Secondary | ICD-10-CM | POA: Diagnosis not present

## 2018-09-03 DIAGNOSIS — I10 Essential (primary) hypertension: Secondary | ICD-10-CM | POA: Diagnosis not present

## 2018-09-03 DIAGNOSIS — Z1389 Encounter for screening for other disorder: Secondary | ICD-10-CM | POA: Diagnosis not present

## 2018-09-03 DIAGNOSIS — E782 Mixed hyperlipidemia: Secondary | ICD-10-CM | POA: Diagnosis not present

## 2018-09-09 DIAGNOSIS — R7303 Prediabetes: Secondary | ICD-10-CM | POA: Diagnosis not present

## 2018-09-09 DIAGNOSIS — E039 Hypothyroidism, unspecified: Secondary | ICD-10-CM | POA: Diagnosis not present

## 2018-09-09 DIAGNOSIS — E782 Mixed hyperlipidemia: Secondary | ICD-10-CM | POA: Diagnosis not present

## 2018-09-09 DIAGNOSIS — I10 Essential (primary) hypertension: Secondary | ICD-10-CM | POA: Diagnosis not present

## 2018-09-30 DIAGNOSIS — K529 Noninfective gastroenteritis and colitis, unspecified: Secondary | ICD-10-CM | POA: Diagnosis not present

## 2018-09-30 DIAGNOSIS — Z800 Family history of malignant neoplasm of digestive organs: Secondary | ICD-10-CM | POA: Diagnosis not present

## 2018-09-30 DIAGNOSIS — K219 Gastro-esophageal reflux disease without esophagitis: Secondary | ICD-10-CM | POA: Diagnosis not present

## 2018-12-16 DIAGNOSIS — Z1231 Encounter for screening mammogram for malignant neoplasm of breast: Secondary | ICD-10-CM | POA: Diagnosis not present

## 2018-12-16 DIAGNOSIS — Z803 Family history of malignant neoplasm of breast: Secondary | ICD-10-CM | POA: Diagnosis not present

## 2019-01-06 DIAGNOSIS — H40023 Open angle with borderline findings, high risk, bilateral: Secondary | ICD-10-CM | POA: Diagnosis not present

## 2019-01-06 DIAGNOSIS — H5319 Other subjective visual disturbances: Secondary | ICD-10-CM | POA: Diagnosis not present

## 2019-02-13 ENCOUNTER — Other Ambulatory Visit: Payer: Self-pay | Admitting: Gastroenterology

## 2019-02-13 DIAGNOSIS — Z800 Family history of malignant neoplasm of digestive organs: Secondary | ICD-10-CM

## 2019-03-10 ENCOUNTER — Ambulatory Visit
Admission: RE | Admit: 2019-03-10 | Discharge: 2019-03-10 | Disposition: A | Discharge status: Home / Self Care | Payer: Medicare HMO | Source: Ambulatory Visit | Attending: Gastroenterology | Admitting: Gastroenterology

## 2019-03-10 DIAGNOSIS — H1045 Other chronic allergic conjunctivitis: Secondary | ICD-10-CM | POA: Diagnosis not present

## 2019-03-10 DIAGNOSIS — D1809 Hemangioma of other sites: Secondary | ICD-10-CM | POA: Diagnosis not present

## 2019-03-10 DIAGNOSIS — J3081 Allergic rhinitis due to animal (cat) (dog) hair and dander: Secondary | ICD-10-CM | POA: Diagnosis not present

## 2019-03-10 DIAGNOSIS — L509 Urticaria, unspecified: Secondary | ICD-10-CM | POA: Diagnosis not present

## 2019-03-10 DIAGNOSIS — Z800 Family history of malignant neoplasm of digestive organs: Secondary | ICD-10-CM

## 2019-03-10 DIAGNOSIS — J3089 Other allergic rhinitis: Secondary | ICD-10-CM | POA: Diagnosis not present

## 2019-03-10 MED ORDER — GADOBENATE DIMEGLUMINE 529 MG/ML IV SOLN
15.0000 mL | Freq: Once | INTRAVENOUS | Status: AC | PRN
  Administered 2019-03-10: 15 mL via INTRAVENOUS

## 2019-03-11 DIAGNOSIS — J309 Allergic rhinitis, unspecified: Secondary | ICD-10-CM | POA: Diagnosis not present

## 2019-03-11 DIAGNOSIS — R7309 Other abnormal glucose: Secondary | ICD-10-CM | POA: Diagnosis not present

## 2019-03-11 DIAGNOSIS — E039 Hypothyroidism, unspecified: Secondary | ICD-10-CM | POA: Diagnosis not present

## 2019-03-11 DIAGNOSIS — I10 Essential (primary) hypertension: Secondary | ICD-10-CM | POA: Diagnosis not present

## 2019-03-11 DIAGNOSIS — Z23 Encounter for immunization: Secondary | ICD-10-CM | POA: Diagnosis not present

## 2019-03-11 DIAGNOSIS — F209 Schizophrenia, unspecified: Secondary | ICD-10-CM | POA: Diagnosis not present

## 2019-03-11 DIAGNOSIS — K219 Gastro-esophageal reflux disease without esophagitis: Secondary | ICD-10-CM | POA: Diagnosis not present

## 2019-03-25 DIAGNOSIS — F250 Schizoaffective disorder, bipolar type: Secondary | ICD-10-CM | POA: Diagnosis not present

## 2019-03-26 DIAGNOSIS — Z03818 Encounter for observation for suspected exposure to other biological agents ruled out: Secondary | ICD-10-CM | POA: Diagnosis not present

## 2019-03-27 DIAGNOSIS — I10 Essential (primary) hypertension: Secondary | ICD-10-CM | POA: Diagnosis not present

## 2019-03-27 DIAGNOSIS — E782 Mixed hyperlipidemia: Secondary | ICD-10-CM | POA: Diagnosis not present

## 2019-03-27 DIAGNOSIS — E039 Hypothyroidism, unspecified: Secondary | ICD-10-CM | POA: Diagnosis not present

## 2019-04-08 DIAGNOSIS — Z800 Family history of malignant neoplasm of digestive organs: Secondary | ICD-10-CM | POA: Diagnosis not present

## 2019-04-08 DIAGNOSIS — K760 Fatty (change of) liver, not elsewhere classified: Secondary | ICD-10-CM | POA: Diagnosis not present

## 2019-04-11 DIAGNOSIS — R05 Cough: Secondary | ICD-10-CM | POA: Diagnosis not present

## 2019-04-13 DIAGNOSIS — R05 Cough: Secondary | ICD-10-CM | POA: Diagnosis not present

## 2019-04-13 DIAGNOSIS — J309 Allergic rhinitis, unspecified: Secondary | ICD-10-CM | POA: Diagnosis not present

## 2019-05-09 DIAGNOSIS — M79604 Pain in right leg: Secondary | ICD-10-CM | POA: Diagnosis not present

## 2019-05-09 DIAGNOSIS — M7711 Lateral epicondylitis, right elbow: Secondary | ICD-10-CM | POA: Diagnosis not present

## 2019-06-24 DIAGNOSIS — Z20828 Contact with and (suspected) exposure to other viral communicable diseases: Secondary | ICD-10-CM | POA: Diagnosis not present

## 2019-07-01 DIAGNOSIS — E039 Hypothyroidism, unspecified: Secondary | ICD-10-CM | POA: Diagnosis not present

## 2019-07-01 DIAGNOSIS — E782 Mixed hyperlipidemia: Secondary | ICD-10-CM | POA: Diagnosis not present

## 2019-07-01 DIAGNOSIS — I10 Essential (primary) hypertension: Secondary | ICD-10-CM | POA: Diagnosis not present

## 2019-07-07 DIAGNOSIS — H25013 Cortical age-related cataract, bilateral: Secondary | ICD-10-CM | POA: Diagnosis not present

## 2019-07-07 DIAGNOSIS — H2513 Age-related nuclear cataract, bilateral: Secondary | ICD-10-CM | POA: Diagnosis not present

## 2019-07-07 DIAGNOSIS — H40023 Open angle with borderline findings, high risk, bilateral: Secondary | ICD-10-CM | POA: Diagnosis not present

## 2019-07-07 DIAGNOSIS — H43392 Other vitreous opacities, left eye: Secondary | ICD-10-CM | POA: Diagnosis not present

## 2019-07-30 DIAGNOSIS — E039 Hypothyroidism, unspecified: Secondary | ICD-10-CM | POA: Diagnosis not present

## 2019-07-30 DIAGNOSIS — E782 Mixed hyperlipidemia: Secondary | ICD-10-CM | POA: Diagnosis not present

## 2019-07-30 DIAGNOSIS — I10 Essential (primary) hypertension: Secondary | ICD-10-CM | POA: Diagnosis not present

## 2019-09-02 DIAGNOSIS — E782 Mixed hyperlipidemia: Secondary | ICD-10-CM | POA: Diagnosis not present

## 2019-09-02 DIAGNOSIS — E039 Hypothyroidism, unspecified: Secondary | ICD-10-CM | POA: Diagnosis not present

## 2019-09-02 DIAGNOSIS — I10 Essential (primary) hypertension: Secondary | ICD-10-CM | POA: Diagnosis not present

## 2019-09-09 DIAGNOSIS — E039 Hypothyroidism, unspecified: Secondary | ICD-10-CM | POA: Diagnosis not present

## 2019-09-09 DIAGNOSIS — K760 Fatty (change of) liver, not elsewhere classified: Secondary | ICD-10-CM | POA: Diagnosis not present

## 2019-09-09 DIAGNOSIS — Z23 Encounter for immunization: Secondary | ICD-10-CM | POA: Diagnosis not present

## 2019-09-09 DIAGNOSIS — R7309 Other abnormal glucose: Secondary | ICD-10-CM | POA: Diagnosis not present

## 2019-09-09 DIAGNOSIS — Z1389 Encounter for screening for other disorder: Secondary | ICD-10-CM | POA: Diagnosis not present

## 2019-09-09 DIAGNOSIS — E782 Mixed hyperlipidemia: Secondary | ICD-10-CM | POA: Diagnosis not present

## 2019-09-09 DIAGNOSIS — I10 Essential (primary) hypertension: Secondary | ICD-10-CM | POA: Diagnosis not present

## 2019-09-09 DIAGNOSIS — F209 Schizophrenia, unspecified: Secondary | ICD-10-CM | POA: Diagnosis not present

## 2019-09-09 DIAGNOSIS — Z0000 Encounter for general adult medical examination without abnormal findings: Secondary | ICD-10-CM | POA: Diagnosis not present

## 2019-09-09 DIAGNOSIS — J309 Allergic rhinitis, unspecified: Secondary | ICD-10-CM | POA: Diagnosis not present

## 2019-12-29 DIAGNOSIS — Z1231 Encounter for screening mammogram for malignant neoplasm of breast: Secondary | ICD-10-CM | POA: Diagnosis not present

## 2020-01-26 DIAGNOSIS — E039 Hypothyroidism, unspecified: Secondary | ICD-10-CM | POA: Diagnosis not present

## 2020-01-26 DIAGNOSIS — I10 Essential (primary) hypertension: Secondary | ICD-10-CM | POA: Diagnosis not present

## 2020-01-26 DIAGNOSIS — E782 Mixed hyperlipidemia: Secondary | ICD-10-CM | POA: Diagnosis not present

## 2020-01-26 DIAGNOSIS — K219 Gastro-esophageal reflux disease without esophagitis: Secondary | ICD-10-CM | POA: Diagnosis not present

## 2020-02-16 DIAGNOSIS — Z800 Family history of malignant neoplasm of digestive organs: Secondary | ICD-10-CM | POA: Diagnosis not present

## 2020-02-16 DIAGNOSIS — K760 Fatty (change of) liver, not elsewhere classified: Secondary | ICD-10-CM | POA: Diagnosis not present

## 2020-02-16 DIAGNOSIS — K219 Gastro-esophageal reflux disease without esophagitis: Secondary | ICD-10-CM | POA: Diagnosis not present

## 2020-02-17 ENCOUNTER — Other Ambulatory Visit: Payer: Self-pay | Admitting: Gastroenterology

## 2020-02-17 DIAGNOSIS — Z800 Family history of malignant neoplasm of digestive organs: Secondary | ICD-10-CM

## 2020-02-22 DIAGNOSIS — Z6826 Body mass index (BMI) 26.0-26.9, adult: Secondary | ICD-10-CM | POA: Diagnosis not present

## 2020-02-22 DIAGNOSIS — Z124 Encounter for screening for malignant neoplasm of cervix: Secondary | ICD-10-CM | POA: Diagnosis not present

## 2020-02-22 DIAGNOSIS — Z01419 Encounter for gynecological examination (general) (routine) without abnormal findings: Secondary | ICD-10-CM | POA: Diagnosis not present

## 2020-02-22 DIAGNOSIS — R350 Frequency of micturition: Secondary | ICD-10-CM | POA: Diagnosis not present

## 2020-03-09 ENCOUNTER — Other Ambulatory Visit: Payer: Medicare HMO

## 2020-03-09 DIAGNOSIS — H1045 Other chronic allergic conjunctivitis: Secondary | ICD-10-CM | POA: Diagnosis not present

## 2020-03-09 DIAGNOSIS — J3081 Allergic rhinitis due to animal (cat) (dog) hair and dander: Secondary | ICD-10-CM | POA: Diagnosis not present

## 2020-03-09 DIAGNOSIS — L509 Urticaria, unspecified: Secondary | ICD-10-CM | POA: Diagnosis not present

## 2020-03-09 DIAGNOSIS — J3089 Other allergic rhinitis: Secondary | ICD-10-CM | POA: Diagnosis not present

## 2020-03-11 ENCOUNTER — Ambulatory Visit
Admission: RE | Admit: 2020-03-11 | Discharge: 2020-03-11 | Disposition: A | Discharge status: Home / Self Care | Payer: Medicare HMO | Source: Ambulatory Visit | Attending: Gastroenterology | Admitting: Gastroenterology

## 2020-03-11 DIAGNOSIS — Z800 Family history of malignant neoplasm of digestive organs: Secondary | ICD-10-CM

## 2020-03-11 DIAGNOSIS — K5900 Constipation, unspecified: Secondary | ICD-10-CM | POA: Diagnosis not present

## 2020-03-11 DIAGNOSIS — D1809 Hemangioma of other sites: Secondary | ICD-10-CM | POA: Diagnosis not present

## 2020-03-11 MED ORDER — GADOBENATE DIMEGLUMINE 529 MG/ML IV SOLN
14.0000 mL | Freq: Once | INTRAVENOUS | Status: AC | PRN
  Administered 2020-03-11: 14 mL via INTRAVENOUS

## 2020-03-16 DIAGNOSIS — E782 Mixed hyperlipidemia: Secondary | ICD-10-CM | POA: Diagnosis not present

## 2020-03-16 DIAGNOSIS — Z23 Encounter for immunization: Secondary | ICD-10-CM | POA: Diagnosis not present

## 2020-03-16 DIAGNOSIS — I10 Essential (primary) hypertension: Secondary | ICD-10-CM | POA: Diagnosis not present

## 2020-03-16 DIAGNOSIS — E039 Hypothyroidism, unspecified: Secondary | ICD-10-CM | POA: Diagnosis not present

## 2020-03-16 DIAGNOSIS — K219 Gastro-esophageal reflux disease without esophagitis: Secondary | ICD-10-CM | POA: Diagnosis not present

## 2020-03-16 DIAGNOSIS — F209 Schizophrenia, unspecified: Secondary | ICD-10-CM | POA: Diagnosis not present

## 2020-03-16 DIAGNOSIS — J309 Allergic rhinitis, unspecified: Secondary | ICD-10-CM | POA: Diagnosis not present

## 2020-03-16 DIAGNOSIS — R7309 Other abnormal glucose: Secondary | ICD-10-CM | POA: Diagnosis not present

## 2020-03-22 DIAGNOSIS — F250 Schizoaffective disorder, bipolar type: Secondary | ICD-10-CM | POA: Diagnosis not present

## 2020-04-12 DIAGNOSIS — Z9189 Other specified personal risk factors, not elsewhere classified: Secondary | ICD-10-CM | POA: Diagnosis not present

## 2020-04-12 DIAGNOSIS — Z803 Family history of malignant neoplasm of breast: Secondary | ICD-10-CM | POA: Diagnosis not present

## 2020-05-11 DIAGNOSIS — K006 Disturbances in tooth eruption: Secondary | ICD-10-CM | POA: Diagnosis not present

## 2020-07-01 DIAGNOSIS — Z20822 Contact with and (suspected) exposure to covid-19: Secondary | ICD-10-CM | POA: Diagnosis not present

## 2020-07-08 DIAGNOSIS — R350 Frequency of micturition: Secondary | ICD-10-CM | POA: Diagnosis not present

## 2020-07-08 DIAGNOSIS — R197 Diarrhea, unspecified: Secondary | ICD-10-CM | POA: Diagnosis not present

## 2020-07-08 DIAGNOSIS — R112 Nausea with vomiting, unspecified: Secondary | ICD-10-CM | POA: Diagnosis not present

## 2020-07-12 DIAGNOSIS — H43392 Other vitreous opacities, left eye: Secondary | ICD-10-CM | POA: Diagnosis not present

## 2020-07-12 DIAGNOSIS — H25013 Cortical age-related cataract, bilateral: Secondary | ICD-10-CM | POA: Diagnosis not present

## 2020-07-12 DIAGNOSIS — H2513 Age-related nuclear cataract, bilateral: Secondary | ICD-10-CM | POA: Diagnosis not present

## 2020-07-12 DIAGNOSIS — H40023 Open angle with borderline findings, high risk, bilateral: Secondary | ICD-10-CM | POA: Diagnosis not present

## 2020-07-20 DIAGNOSIS — Z20822 Contact with and (suspected) exposure to covid-19: Secondary | ICD-10-CM | POA: Diagnosis not present

## 2020-09-14 DIAGNOSIS — Z1389 Encounter for screening for other disorder: Secondary | ICD-10-CM | POA: Diagnosis not present

## 2020-09-14 DIAGNOSIS — Z0000 Encounter for general adult medical examination without abnormal findings: Secondary | ICD-10-CM | POA: Diagnosis not present

## 2020-09-14 DIAGNOSIS — I10 Essential (primary) hypertension: Secondary | ICD-10-CM | POA: Diagnosis not present

## 2020-09-14 DIAGNOSIS — F209 Schizophrenia, unspecified: Secondary | ICD-10-CM | POA: Diagnosis not present

## 2020-09-14 DIAGNOSIS — R7303 Prediabetes: Secondary | ICD-10-CM | POA: Diagnosis not present

## 2020-09-14 DIAGNOSIS — E782 Mixed hyperlipidemia: Secondary | ICD-10-CM | POA: Diagnosis not present

## 2020-09-14 DIAGNOSIS — K219 Gastro-esophageal reflux disease without esophagitis: Secondary | ICD-10-CM | POA: Diagnosis not present

## 2020-09-14 DIAGNOSIS — J309 Allergic rhinitis, unspecified: Secondary | ICD-10-CM | POA: Diagnosis not present

## 2020-09-14 DIAGNOSIS — E039 Hypothyroidism, unspecified: Secondary | ICD-10-CM | POA: Diagnosis not present

## 2020-10-20 DIAGNOSIS — L989 Disorder of the skin and subcutaneous tissue, unspecified: Secondary | ICD-10-CM | POA: Diagnosis not present

## 2020-10-26 DIAGNOSIS — M79602 Pain in left arm: Secondary | ICD-10-CM | POA: Diagnosis not present

## 2021-01-24 DIAGNOSIS — Z1231 Encounter for screening mammogram for malignant neoplasm of breast: Secondary | ICD-10-CM | POA: Diagnosis not present

## 2021-02-20 ENCOUNTER — Other Ambulatory Visit: Payer: Self-pay | Admitting: Internal Medicine

## 2021-02-20 ENCOUNTER — Ambulatory Visit
Admission: RE | Admit: 2021-02-20 | Discharge: 2021-02-20 | Disposition: A | Discharge status: Home / Self Care | Payer: Medicare HMO | Source: Ambulatory Visit | Attending: Internal Medicine | Admitting: Internal Medicine

## 2021-02-20 DIAGNOSIS — M542 Cervicalgia: Secondary | ICD-10-CM

## 2021-02-20 DIAGNOSIS — M546 Pain in thoracic spine: Secondary | ICD-10-CM | POA: Diagnosis not present

## 2021-02-20 DIAGNOSIS — M549 Dorsalgia, unspecified: Secondary | ICD-10-CM

## 2021-02-20 DIAGNOSIS — R079 Chest pain, unspecified: Secondary | ICD-10-CM | POA: Diagnosis not present

## 2021-03-07 DIAGNOSIS — M79604 Pain in right leg: Secondary | ICD-10-CM | POA: Diagnosis not present

## 2021-03-07 DIAGNOSIS — M79602 Pain in left arm: Secondary | ICD-10-CM | POA: Diagnosis not present

## 2021-03-07 DIAGNOSIS — M79642 Pain in left hand: Secondary | ICD-10-CM | POA: Diagnosis not present

## 2021-03-07 DIAGNOSIS — R2689 Other abnormalities of gait and mobility: Secondary | ICD-10-CM | POA: Diagnosis not present

## 2021-03-07 DIAGNOSIS — M542 Cervicalgia: Secondary | ICD-10-CM | POA: Diagnosis not present

## 2021-03-08 DIAGNOSIS — H1045 Other chronic allergic conjunctivitis: Secondary | ICD-10-CM | POA: Diagnosis not present

## 2021-03-08 DIAGNOSIS — L509 Urticaria, unspecified: Secondary | ICD-10-CM | POA: Diagnosis not present

## 2021-03-08 DIAGNOSIS — J3089 Other allergic rhinitis: Secondary | ICD-10-CM | POA: Diagnosis not present

## 2021-03-08 DIAGNOSIS — J3081 Allergic rhinitis due to animal (cat) (dog) hair and dander: Secondary | ICD-10-CM | POA: Diagnosis not present

## 2021-03-14 DIAGNOSIS — M79604 Pain in right leg: Secondary | ICD-10-CM | POA: Diagnosis not present

## 2021-03-14 DIAGNOSIS — R2689 Other abnormalities of gait and mobility: Secondary | ICD-10-CM | POA: Diagnosis not present

## 2021-03-14 DIAGNOSIS — M542 Cervicalgia: Secondary | ICD-10-CM | POA: Diagnosis not present

## 2021-03-14 DIAGNOSIS — M79642 Pain in left hand: Secondary | ICD-10-CM | POA: Diagnosis not present

## 2021-03-14 DIAGNOSIS — M79602 Pain in left arm: Secondary | ICD-10-CM | POA: Diagnosis not present

## 2021-03-15 DIAGNOSIS — Z23 Encounter for immunization: Secondary | ICD-10-CM | POA: Diagnosis not present

## 2021-03-15 DIAGNOSIS — M5090 Cervical disc disorder, unspecified, unspecified cervical region: Secondary | ICD-10-CM | POA: Diagnosis not present

## 2021-03-15 DIAGNOSIS — J309 Allergic rhinitis, unspecified: Secondary | ICD-10-CM | POA: Diagnosis not present

## 2021-03-15 DIAGNOSIS — R7303 Prediabetes: Secondary | ICD-10-CM | POA: Diagnosis not present

## 2021-03-15 DIAGNOSIS — I10 Essential (primary) hypertension: Secondary | ICD-10-CM | POA: Diagnosis not present

## 2021-03-15 DIAGNOSIS — E039 Hypothyroidism, unspecified: Secondary | ICD-10-CM | POA: Diagnosis not present

## 2021-03-15 DIAGNOSIS — F209 Schizophrenia, unspecified: Secondary | ICD-10-CM | POA: Diagnosis not present

## 2021-03-15 DIAGNOSIS — E782 Mixed hyperlipidemia: Secondary | ICD-10-CM | POA: Diagnosis not present

## 2021-03-15 DIAGNOSIS — K219 Gastro-esophageal reflux disease without esophagitis: Secondary | ICD-10-CM | POA: Diagnosis not present

## 2021-03-16 DIAGNOSIS — M79604 Pain in right leg: Secondary | ICD-10-CM | POA: Diagnosis not present

## 2021-03-16 DIAGNOSIS — M79642 Pain in left hand: Secondary | ICD-10-CM | POA: Diagnosis not present

## 2021-03-16 DIAGNOSIS — R2689 Other abnormalities of gait and mobility: Secondary | ICD-10-CM | POA: Diagnosis not present

## 2021-03-16 DIAGNOSIS — M542 Cervicalgia: Secondary | ICD-10-CM | POA: Diagnosis not present

## 2021-03-16 DIAGNOSIS — M79602 Pain in left arm: Secondary | ICD-10-CM | POA: Diagnosis not present

## 2021-03-20 ENCOUNTER — Other Ambulatory Visit: Payer: Self-pay | Admitting: Gastroenterology

## 2021-03-20 DIAGNOSIS — R2689 Other abnormalities of gait and mobility: Secondary | ICD-10-CM | POA: Diagnosis not present

## 2021-03-20 DIAGNOSIS — M79642 Pain in left hand: Secondary | ICD-10-CM | POA: Diagnosis not present

## 2021-03-20 DIAGNOSIS — M542 Cervicalgia: Secondary | ICD-10-CM | POA: Diagnosis not present

## 2021-03-20 DIAGNOSIS — Z800 Family history of malignant neoplasm of digestive organs: Secondary | ICD-10-CM

## 2021-03-20 DIAGNOSIS — M79602 Pain in left arm: Secondary | ICD-10-CM | POA: Diagnosis not present

## 2021-03-20 DIAGNOSIS — M79604 Pain in right leg: Secondary | ICD-10-CM | POA: Diagnosis not present

## 2021-03-22 DIAGNOSIS — R2689 Other abnormalities of gait and mobility: Secondary | ICD-10-CM | POA: Diagnosis not present

## 2021-03-22 DIAGNOSIS — M79604 Pain in right leg: Secondary | ICD-10-CM | POA: Diagnosis not present

## 2021-03-22 DIAGNOSIS — M542 Cervicalgia: Secondary | ICD-10-CM | POA: Diagnosis not present

## 2021-03-22 DIAGNOSIS — M79602 Pain in left arm: Secondary | ICD-10-CM | POA: Diagnosis not present

## 2021-03-22 DIAGNOSIS — M79642 Pain in left hand: Secondary | ICD-10-CM | POA: Diagnosis not present

## 2021-03-27 DIAGNOSIS — M79602 Pain in left arm: Secondary | ICD-10-CM | POA: Diagnosis not present

## 2021-03-27 DIAGNOSIS — M79604 Pain in right leg: Secondary | ICD-10-CM | POA: Diagnosis not present

## 2021-03-27 DIAGNOSIS — R2689 Other abnormalities of gait and mobility: Secondary | ICD-10-CM | POA: Diagnosis not present

## 2021-03-27 DIAGNOSIS — M542 Cervicalgia: Secondary | ICD-10-CM | POA: Diagnosis not present

## 2021-03-27 DIAGNOSIS — M79642 Pain in left hand: Secondary | ICD-10-CM | POA: Diagnosis not present

## 2021-03-29 DIAGNOSIS — M79642 Pain in left hand: Secondary | ICD-10-CM | POA: Diagnosis not present

## 2021-03-29 DIAGNOSIS — M79602 Pain in left arm: Secondary | ICD-10-CM | POA: Diagnosis not present

## 2021-03-29 DIAGNOSIS — R2689 Other abnormalities of gait and mobility: Secondary | ICD-10-CM | POA: Diagnosis not present

## 2021-03-29 DIAGNOSIS — M542 Cervicalgia: Secondary | ICD-10-CM | POA: Diagnosis not present

## 2021-03-29 DIAGNOSIS — M79604 Pain in right leg: Secondary | ICD-10-CM | POA: Diagnosis not present

## 2021-04-02 ENCOUNTER — Other Ambulatory Visit: Payer: Self-pay

## 2021-04-02 ENCOUNTER — Ambulatory Visit
Admission: RE | Admit: 2021-04-02 | Discharge: 2021-04-02 | Disposition: A | Discharge status: Home / Self Care | Payer: Medicare HMO | Source: Ambulatory Visit | Attending: Gastroenterology | Admitting: Gastroenterology

## 2021-04-02 DIAGNOSIS — Z8507 Personal history of malignant neoplasm of pancreas: Secondary | ICD-10-CM | POA: Diagnosis not present

## 2021-04-02 DIAGNOSIS — I700 Atherosclerosis of aorta: Secondary | ICD-10-CM | POA: Diagnosis not present

## 2021-04-02 DIAGNOSIS — Z800 Family history of malignant neoplasm of digestive organs: Secondary | ICD-10-CM

## 2021-04-02 DIAGNOSIS — N281 Cyst of kidney, acquired: Secondary | ICD-10-CM | POA: Diagnosis not present

## 2021-04-02 DIAGNOSIS — D1803 Hemangioma of intra-abdominal structures: Secondary | ICD-10-CM | POA: Diagnosis not present

## 2021-04-02 MED ORDER — GADOBENATE DIMEGLUMINE 529 MG/ML IV SOLN
15.0000 mL | Freq: Once | INTRAVENOUS | Status: AC | PRN
  Administered 2021-04-02: 15 mL via INTRAVENOUS

## 2021-04-04 DIAGNOSIS — M79602 Pain in left arm: Secondary | ICD-10-CM | POA: Diagnosis not present

## 2021-04-04 DIAGNOSIS — M542 Cervicalgia: Secondary | ICD-10-CM | POA: Diagnosis not present

## 2021-04-04 DIAGNOSIS — M79604 Pain in right leg: Secondary | ICD-10-CM | POA: Diagnosis not present

## 2021-04-04 DIAGNOSIS — R2689 Other abnormalities of gait and mobility: Secondary | ICD-10-CM | POA: Diagnosis not present

## 2021-04-04 DIAGNOSIS — M79642 Pain in left hand: Secondary | ICD-10-CM | POA: Diagnosis not present

## 2021-04-12 DIAGNOSIS — M542 Cervicalgia: Secondary | ICD-10-CM | POA: Diagnosis not present

## 2021-04-12 DIAGNOSIS — R2689 Other abnormalities of gait and mobility: Secondary | ICD-10-CM | POA: Diagnosis not present

## 2021-04-12 DIAGNOSIS — M79604 Pain in right leg: Secondary | ICD-10-CM | POA: Diagnosis not present

## 2021-04-12 DIAGNOSIS — M79642 Pain in left hand: Secondary | ICD-10-CM | POA: Diagnosis not present

## 2021-04-12 DIAGNOSIS — M79602 Pain in left arm: Secondary | ICD-10-CM | POA: Diagnosis not present

## 2021-04-19 DIAGNOSIS — M542 Cervicalgia: Secondary | ICD-10-CM | POA: Diagnosis not present

## 2021-04-19 DIAGNOSIS — M79604 Pain in right leg: Secondary | ICD-10-CM | POA: Diagnosis not present

## 2021-04-19 DIAGNOSIS — M79602 Pain in left arm: Secondary | ICD-10-CM | POA: Diagnosis not present

## 2021-04-19 DIAGNOSIS — R2689 Other abnormalities of gait and mobility: Secondary | ICD-10-CM | POA: Diagnosis not present

## 2021-04-19 DIAGNOSIS — M79642 Pain in left hand: Secondary | ICD-10-CM | POA: Diagnosis not present

## 2021-05-02 DIAGNOSIS — M5412 Radiculopathy, cervical region: Secondary | ICD-10-CM | POA: Diagnosis not present

## 2021-05-03 DIAGNOSIS — M79602 Pain in left arm: Secondary | ICD-10-CM | POA: Diagnosis not present

## 2021-05-03 DIAGNOSIS — M79604 Pain in right leg: Secondary | ICD-10-CM | POA: Diagnosis not present

## 2021-05-03 DIAGNOSIS — M542 Cervicalgia: Secondary | ICD-10-CM | POA: Diagnosis not present

## 2021-05-03 DIAGNOSIS — M79642 Pain in left hand: Secondary | ICD-10-CM | POA: Diagnosis not present

## 2021-05-03 DIAGNOSIS — F250 Schizoaffective disorder, bipolar type: Secondary | ICD-10-CM | POA: Diagnosis not present

## 2021-05-03 DIAGNOSIS — R2689 Other abnormalities of gait and mobility: Secondary | ICD-10-CM | POA: Diagnosis not present

## 2021-05-10 DIAGNOSIS — M79602 Pain in left arm: Secondary | ICD-10-CM | POA: Diagnosis not present

## 2021-05-10 DIAGNOSIS — M79642 Pain in left hand: Secondary | ICD-10-CM | POA: Diagnosis not present

## 2021-05-10 DIAGNOSIS — R2689 Other abnormalities of gait and mobility: Secondary | ICD-10-CM | POA: Diagnosis not present

## 2021-05-10 DIAGNOSIS — M542 Cervicalgia: Secondary | ICD-10-CM | POA: Diagnosis not present

## 2021-05-10 DIAGNOSIS — M79604 Pain in right leg: Secondary | ICD-10-CM | POA: Diagnosis not present

## 2021-05-18 DIAGNOSIS — M542 Cervicalgia: Secondary | ICD-10-CM | POA: Diagnosis not present

## 2021-05-22 DIAGNOSIS — M5416 Radiculopathy, lumbar region: Secondary | ICD-10-CM | POA: Diagnosis not present

## 2021-06-06 DIAGNOSIS — M545 Low back pain, unspecified: Secondary | ICD-10-CM | POA: Diagnosis not present

## 2021-06-07 DIAGNOSIS — M542 Cervicalgia: Secondary | ICD-10-CM | POA: Diagnosis not present

## 2021-06-07 DIAGNOSIS — M79602 Pain in left arm: Secondary | ICD-10-CM | POA: Diagnosis not present

## 2021-06-07 DIAGNOSIS — M79604 Pain in right leg: Secondary | ICD-10-CM | POA: Diagnosis not present

## 2021-06-07 DIAGNOSIS — M79642 Pain in left hand: Secondary | ICD-10-CM | POA: Diagnosis not present

## 2021-06-07 DIAGNOSIS — R2689 Other abnormalities of gait and mobility: Secondary | ICD-10-CM | POA: Diagnosis not present

## 2021-06-20 DIAGNOSIS — M5416 Radiculopathy, lumbar region: Secondary | ICD-10-CM | POA: Diagnosis not present

## 2021-06-21 DIAGNOSIS — R2689 Other abnormalities of gait and mobility: Secondary | ICD-10-CM | POA: Diagnosis not present

## 2021-06-21 DIAGNOSIS — M542 Cervicalgia: Secondary | ICD-10-CM | POA: Diagnosis not present

## 2021-06-21 DIAGNOSIS — M79642 Pain in left hand: Secondary | ICD-10-CM | POA: Diagnosis not present

## 2021-06-21 DIAGNOSIS — M79604 Pain in right leg: Secondary | ICD-10-CM | POA: Diagnosis not present

## 2021-06-21 DIAGNOSIS — M79602 Pain in left arm: Secondary | ICD-10-CM | POA: Diagnosis not present

## 2021-06-26 DIAGNOSIS — M5416 Radiculopathy, lumbar region: Secondary | ICD-10-CM | POA: Diagnosis not present

## 2021-06-28 DIAGNOSIS — M79602 Pain in left arm: Secondary | ICD-10-CM | POA: Diagnosis not present

## 2021-06-28 DIAGNOSIS — M79604 Pain in right leg: Secondary | ICD-10-CM | POA: Diagnosis not present

## 2021-06-28 DIAGNOSIS — R2689 Other abnormalities of gait and mobility: Secondary | ICD-10-CM | POA: Diagnosis not present

## 2021-06-28 DIAGNOSIS — M79642 Pain in left hand: Secondary | ICD-10-CM | POA: Diagnosis not present

## 2021-06-28 DIAGNOSIS — M542 Cervicalgia: Secondary | ICD-10-CM | POA: Diagnosis not present

## 2021-07-05 DIAGNOSIS — M542 Cervicalgia: Secondary | ICD-10-CM | POA: Diagnosis not present

## 2021-07-05 DIAGNOSIS — M79642 Pain in left hand: Secondary | ICD-10-CM | POA: Diagnosis not present

## 2021-07-05 DIAGNOSIS — M79604 Pain in right leg: Secondary | ICD-10-CM | POA: Diagnosis not present

## 2021-07-05 DIAGNOSIS — M79602 Pain in left arm: Secondary | ICD-10-CM | POA: Diagnosis not present

## 2021-07-05 DIAGNOSIS — R2689 Other abnormalities of gait and mobility: Secondary | ICD-10-CM | POA: Diagnosis not present

## 2021-07-12 DIAGNOSIS — M5416 Radiculopathy, lumbar region: Secondary | ICD-10-CM | POA: Diagnosis not present

## 2021-07-19 DIAGNOSIS — R634 Abnormal weight loss: Secondary | ICD-10-CM | POA: Diagnosis not present

## 2021-07-19 DIAGNOSIS — R110 Nausea: Secondary | ICD-10-CM | POA: Diagnosis not present

## 2021-07-19 DIAGNOSIS — E039 Hypothyroidism, unspecified: Secondary | ICD-10-CM | POA: Diagnosis not present

## 2021-07-21 DIAGNOSIS — R110 Nausea: Secondary | ICD-10-CM | POA: Diagnosis not present

## 2021-07-21 DIAGNOSIS — K760 Fatty (change of) liver, not elsewhere classified: Secondary | ICD-10-CM | POA: Diagnosis not present

## 2021-07-26 DIAGNOSIS — M542 Cervicalgia: Secondary | ICD-10-CM | POA: Diagnosis not present

## 2021-07-26 DIAGNOSIS — M79604 Pain in right leg: Secondary | ICD-10-CM | POA: Diagnosis not present

## 2021-07-26 DIAGNOSIS — M79642 Pain in left hand: Secondary | ICD-10-CM | POA: Diagnosis not present

## 2021-07-26 DIAGNOSIS — R2689 Other abnormalities of gait and mobility: Secondary | ICD-10-CM | POA: Diagnosis not present

## 2021-07-26 DIAGNOSIS — M79602 Pain in left arm: Secondary | ICD-10-CM | POA: Diagnosis not present

## 2021-07-27 DIAGNOSIS — K219 Gastro-esophageal reflux disease without esophagitis: Secondary | ICD-10-CM | POA: Diagnosis not present

## 2021-07-27 DIAGNOSIS — K760 Fatty (change of) liver, not elsewhere classified: Secondary | ICD-10-CM | POA: Diagnosis not present

## 2021-07-31 DIAGNOSIS — M5416 Radiculopathy, lumbar region: Secondary | ICD-10-CM | POA: Diagnosis not present

## 2021-08-02 DIAGNOSIS — M79602 Pain in left arm: Secondary | ICD-10-CM | POA: Diagnosis not present

## 2021-08-02 DIAGNOSIS — M79604 Pain in right leg: Secondary | ICD-10-CM | POA: Diagnosis not present

## 2021-08-02 DIAGNOSIS — M79642 Pain in left hand: Secondary | ICD-10-CM | POA: Diagnosis not present

## 2021-08-02 DIAGNOSIS — R2689 Other abnormalities of gait and mobility: Secondary | ICD-10-CM | POA: Diagnosis not present

## 2021-08-02 DIAGNOSIS — M542 Cervicalgia: Secondary | ICD-10-CM | POA: Diagnosis not present

## 2021-08-09 DIAGNOSIS — M25551 Pain in right hip: Secondary | ICD-10-CM | POA: Diagnosis not present

## 2021-08-09 DIAGNOSIS — R2689 Other abnormalities of gait and mobility: Secondary | ICD-10-CM | POA: Diagnosis not present

## 2021-08-09 DIAGNOSIS — M79604 Pain in right leg: Secondary | ICD-10-CM | POA: Diagnosis not present

## 2021-08-09 DIAGNOSIS — M542 Cervicalgia: Secondary | ICD-10-CM | POA: Diagnosis not present

## 2021-08-09 DIAGNOSIS — M79602 Pain in left arm: Secondary | ICD-10-CM | POA: Diagnosis not present

## 2021-08-09 DIAGNOSIS — M79642 Pain in left hand: Secondary | ICD-10-CM | POA: Diagnosis not present

## 2021-08-09 DIAGNOSIS — M1611 Unilateral primary osteoarthritis, right hip: Secondary | ICD-10-CM | POA: Diagnosis not present

## 2021-08-23 DIAGNOSIS — M79604 Pain in right leg: Secondary | ICD-10-CM | POA: Diagnosis not present

## 2021-08-23 DIAGNOSIS — M79642 Pain in left hand: Secondary | ICD-10-CM | POA: Diagnosis not present

## 2021-08-23 DIAGNOSIS — M542 Cervicalgia: Secondary | ICD-10-CM | POA: Diagnosis not present

## 2021-08-23 DIAGNOSIS — R2689 Other abnormalities of gait and mobility: Secondary | ICD-10-CM | POA: Diagnosis not present

## 2021-08-23 DIAGNOSIS — M79602 Pain in left arm: Secondary | ICD-10-CM | POA: Diagnosis not present

## 2021-08-28 DIAGNOSIS — M1611 Unilateral primary osteoarthritis, right hip: Secondary | ICD-10-CM | POA: Diagnosis not present

## 2021-08-30 DIAGNOSIS — M542 Cervicalgia: Secondary | ICD-10-CM | POA: Diagnosis not present

## 2021-08-30 DIAGNOSIS — M79604 Pain in right leg: Secondary | ICD-10-CM | POA: Diagnosis not present

## 2021-08-30 DIAGNOSIS — M79642 Pain in left hand: Secondary | ICD-10-CM | POA: Diagnosis not present

## 2021-08-30 DIAGNOSIS — R2689 Other abnormalities of gait and mobility: Secondary | ICD-10-CM | POA: Diagnosis not present

## 2021-08-30 DIAGNOSIS — M79602 Pain in left arm: Secondary | ICD-10-CM | POA: Diagnosis not present

## 2021-09-20 DIAGNOSIS — M8580 Other specified disorders of bone density and structure, unspecified site: Secondary | ICD-10-CM | POA: Diagnosis not present

## 2021-09-20 DIAGNOSIS — I10 Essential (primary) hypertension: Secondary | ICD-10-CM | POA: Diagnosis not present

## 2021-09-20 DIAGNOSIS — Z0000 Encounter for general adult medical examination without abnormal findings: Secondary | ICD-10-CM | POA: Diagnosis not present

## 2021-09-20 DIAGNOSIS — K219 Gastro-esophageal reflux disease without esophagitis: Secondary | ICD-10-CM | POA: Diagnosis not present

## 2021-09-20 DIAGNOSIS — E039 Hypothyroidism, unspecified: Secondary | ICD-10-CM | POA: Diagnosis not present

## 2021-09-20 DIAGNOSIS — E782 Mixed hyperlipidemia: Secondary | ICD-10-CM | POA: Diagnosis not present

## 2021-09-20 DIAGNOSIS — F209 Schizophrenia, unspecified: Secondary | ICD-10-CM | POA: Diagnosis not present

## 2021-09-20 DIAGNOSIS — R7303 Prediabetes: Secondary | ICD-10-CM | POA: Diagnosis not present

## 2021-09-20 DIAGNOSIS — K760 Fatty (change of) liver, not elsewhere classified: Secondary | ICD-10-CM | POA: Diagnosis not present

## 2021-10-02 DIAGNOSIS — R1084 Generalized abdominal pain: Secondary | ICD-10-CM | POA: Diagnosis not present

## 2021-10-02 DIAGNOSIS — R110 Nausea: Secondary | ICD-10-CM | POA: Diagnosis not present

## 2021-10-02 DIAGNOSIS — R197 Diarrhea, unspecified: Secondary | ICD-10-CM | POA: Diagnosis not present

## 2021-10-24 DIAGNOSIS — H04123 Dry eye syndrome of bilateral lacrimal glands: Secondary | ICD-10-CM | POA: Diagnosis not present

## 2021-10-24 DIAGNOSIS — H40023 Open angle with borderline findings, high risk, bilateral: Secondary | ICD-10-CM | POA: Diagnosis not present

## 2021-10-24 DIAGNOSIS — H25813 Combined forms of age-related cataract, bilateral: Secondary | ICD-10-CM | POA: Diagnosis not present

## 2021-11-06 DIAGNOSIS — R262 Difficulty in walking, not elsewhere classified: Secondary | ICD-10-CM | POA: Diagnosis not present

## 2021-11-06 DIAGNOSIS — M25651 Stiffness of right hip, not elsewhere classified: Secondary | ICD-10-CM | POA: Diagnosis not present

## 2021-11-06 DIAGNOSIS — M25551 Pain in right hip: Secondary | ICD-10-CM | POA: Diagnosis not present

## 2021-12-11 DIAGNOSIS — Z01818 Encounter for other preprocedural examination: Secondary | ICD-10-CM | POA: Diagnosis not present

## 2021-12-18 DIAGNOSIS — M1611 Unilateral primary osteoarthritis, right hip: Secondary | ICD-10-CM | POA: Diagnosis not present

## 2021-12-18 DIAGNOSIS — M47816 Spondylosis without myelopathy or radiculopathy, lumbar region: Secondary | ICD-10-CM | POA: Diagnosis not present

## 2021-12-28 DIAGNOSIS — M1611 Unilateral primary osteoarthritis, right hip: Secondary | ICD-10-CM | POA: Diagnosis not present

## 2021-12-28 DIAGNOSIS — Z96641 Presence of right artificial hip joint: Secondary | ICD-10-CM | POA: Diagnosis not present

## 2022-01-01 DIAGNOSIS — M25651 Stiffness of right hip, not elsewhere classified: Secondary | ICD-10-CM | POA: Diagnosis not present

## 2022-01-01 DIAGNOSIS — Z96641 Presence of right artificial hip joint: Secondary | ICD-10-CM | POA: Diagnosis not present

## 2022-01-01 DIAGNOSIS — M6281 Muscle weakness (generalized): Secondary | ICD-10-CM | POA: Diagnosis not present

## 2022-01-05 DIAGNOSIS — M25651 Stiffness of right hip, not elsewhere classified: Secondary | ICD-10-CM | POA: Diagnosis not present

## 2022-01-05 DIAGNOSIS — Z96641 Presence of right artificial hip joint: Secondary | ICD-10-CM | POA: Diagnosis not present

## 2022-01-05 DIAGNOSIS — M6281 Muscle weakness (generalized): Secondary | ICD-10-CM | POA: Diagnosis not present

## 2022-01-08 DIAGNOSIS — M25651 Stiffness of right hip, not elsewhere classified: Secondary | ICD-10-CM | POA: Diagnosis not present

## 2022-01-08 DIAGNOSIS — Z471 Aftercare following joint replacement surgery: Secondary | ICD-10-CM | POA: Diagnosis not present

## 2022-01-08 DIAGNOSIS — M6281 Muscle weakness (generalized): Secondary | ICD-10-CM | POA: Diagnosis not present

## 2022-01-08 DIAGNOSIS — Z96641 Presence of right artificial hip joint: Secondary | ICD-10-CM | POA: Diagnosis not present

## 2022-01-11 DIAGNOSIS — M6281 Muscle weakness (generalized): Secondary | ICD-10-CM | POA: Diagnosis not present

## 2022-01-11 DIAGNOSIS — Z96641 Presence of right artificial hip joint: Secondary | ICD-10-CM | POA: Diagnosis not present

## 2022-01-11 DIAGNOSIS — M25651 Stiffness of right hip, not elsewhere classified: Secondary | ICD-10-CM | POA: Diagnosis not present

## 2022-01-16 DIAGNOSIS — M25651 Stiffness of right hip, not elsewhere classified: Secondary | ICD-10-CM | POA: Diagnosis not present

## 2022-01-16 DIAGNOSIS — M6281 Muscle weakness (generalized): Secondary | ICD-10-CM | POA: Diagnosis not present

## 2022-01-16 DIAGNOSIS — Z96641 Presence of right artificial hip joint: Secondary | ICD-10-CM | POA: Diagnosis not present

## 2022-01-18 DIAGNOSIS — M25661 Stiffness of right knee, not elsewhere classified: Secondary | ICD-10-CM | POA: Diagnosis not present

## 2022-01-18 DIAGNOSIS — M6281 Muscle weakness (generalized): Secondary | ICD-10-CM | POA: Diagnosis not present

## 2022-01-18 DIAGNOSIS — Z96641 Presence of right artificial hip joint: Secondary | ICD-10-CM | POA: Diagnosis not present

## 2022-01-23 DIAGNOSIS — M25651 Stiffness of right hip, not elsewhere classified: Secondary | ICD-10-CM | POA: Diagnosis not present

## 2022-01-23 DIAGNOSIS — M6281 Muscle weakness (generalized): Secondary | ICD-10-CM | POA: Diagnosis not present

## 2022-01-23 DIAGNOSIS — Z96641 Presence of right artificial hip joint: Secondary | ICD-10-CM | POA: Diagnosis not present

## 2022-01-29 DIAGNOSIS — M25651 Stiffness of right hip, not elsewhere classified: Secondary | ICD-10-CM | POA: Diagnosis not present

## 2022-01-29 DIAGNOSIS — M6281 Muscle weakness (generalized): Secondary | ICD-10-CM | POA: Diagnosis not present

## 2022-01-29 DIAGNOSIS — Z96641 Presence of right artificial hip joint: Secondary | ICD-10-CM | POA: Diagnosis not present

## 2022-01-30 DIAGNOSIS — Z1231 Encounter for screening mammogram for malignant neoplasm of breast: Secondary | ICD-10-CM | POA: Diagnosis not present

## 2022-01-31 DIAGNOSIS — M6281 Muscle weakness (generalized): Secondary | ICD-10-CM | POA: Diagnosis not present

## 2022-01-31 DIAGNOSIS — M25651 Stiffness of right hip, not elsewhere classified: Secondary | ICD-10-CM | POA: Diagnosis not present

## 2022-01-31 DIAGNOSIS — Z96641 Presence of right artificial hip joint: Secondary | ICD-10-CM | POA: Diagnosis not present

## 2022-02-06 DIAGNOSIS — M25651 Stiffness of right hip, not elsewhere classified: Secondary | ICD-10-CM | POA: Diagnosis not present

## 2022-02-06 DIAGNOSIS — M6281 Muscle weakness (generalized): Secondary | ICD-10-CM | POA: Diagnosis not present

## 2022-02-06 DIAGNOSIS — Z96641 Presence of right artificial hip joint: Secondary | ICD-10-CM | POA: Diagnosis not present

## 2022-02-08 DIAGNOSIS — M25651 Stiffness of right hip, not elsewhere classified: Secondary | ICD-10-CM | POA: Diagnosis not present

## 2022-02-08 DIAGNOSIS — M6281 Muscle weakness (generalized): Secondary | ICD-10-CM | POA: Diagnosis not present

## 2022-02-08 DIAGNOSIS — Z96641 Presence of right artificial hip joint: Secondary | ICD-10-CM | POA: Diagnosis not present

## 2022-02-12 DIAGNOSIS — Z96641 Presence of right artificial hip joint: Secondary | ICD-10-CM | POA: Diagnosis not present

## 2022-02-12 DIAGNOSIS — M25651 Stiffness of right hip, not elsewhere classified: Secondary | ICD-10-CM | POA: Diagnosis not present

## 2022-02-12 DIAGNOSIS — M6281 Muscle weakness (generalized): Secondary | ICD-10-CM | POA: Diagnosis not present

## 2022-02-14 DIAGNOSIS — Z96641 Presence of right artificial hip joint: Secondary | ICD-10-CM | POA: Diagnosis not present

## 2022-02-14 DIAGNOSIS — M6281 Muscle weakness (generalized): Secondary | ICD-10-CM | POA: Diagnosis not present

## 2022-02-14 DIAGNOSIS — M25651 Stiffness of right hip, not elsewhere classified: Secondary | ICD-10-CM | POA: Diagnosis not present

## 2022-02-16 DIAGNOSIS — M4802 Spinal stenosis, cervical region: Secondary | ICD-10-CM | POA: Diagnosis not present

## 2022-02-16 DIAGNOSIS — M5412 Radiculopathy, cervical region: Secondary | ICD-10-CM | POA: Diagnosis not present

## 2022-02-19 DIAGNOSIS — M6281 Muscle weakness (generalized): Secondary | ICD-10-CM | POA: Diagnosis not present

## 2022-02-19 DIAGNOSIS — M25651 Stiffness of right hip, not elsewhere classified: Secondary | ICD-10-CM | POA: Diagnosis not present

## 2022-02-19 DIAGNOSIS — Z96641 Presence of right artificial hip joint: Secondary | ICD-10-CM | POA: Diagnosis not present

## 2022-02-21 DIAGNOSIS — M6281 Muscle weakness (generalized): Secondary | ICD-10-CM | POA: Diagnosis not present

## 2022-02-21 DIAGNOSIS — M25651 Stiffness of right hip, not elsewhere classified: Secondary | ICD-10-CM | POA: Diagnosis not present

## 2022-02-21 DIAGNOSIS — Z96641 Presence of right artificial hip joint: Secondary | ICD-10-CM | POA: Diagnosis not present

## 2022-02-26 DIAGNOSIS — Z6824 Body mass index (BMI) 24.0-24.9, adult: Secondary | ICD-10-CM | POA: Diagnosis not present

## 2022-02-26 DIAGNOSIS — N841 Polyp of cervix uteri: Secondary | ICD-10-CM | POA: Diagnosis not present

## 2022-02-26 DIAGNOSIS — Z01419 Encounter for gynecological examination (general) (routine) without abnormal findings: Secondary | ICD-10-CM | POA: Diagnosis not present

## 2022-02-28 DIAGNOSIS — M5412 Radiculopathy, cervical region: Secondary | ICD-10-CM | POA: Diagnosis not present

## 2022-02-28 DIAGNOSIS — M4802 Spinal stenosis, cervical region: Secondary | ICD-10-CM | POA: Diagnosis not present

## 2022-03-20 DIAGNOSIS — I10 Essential (primary) hypertension: Secondary | ICD-10-CM | POA: Diagnosis not present

## 2022-03-20 DIAGNOSIS — R7303 Prediabetes: Secondary | ICD-10-CM | POA: Diagnosis not present

## 2022-03-20 DIAGNOSIS — Z23 Encounter for immunization: Secondary | ICD-10-CM | POA: Diagnosis not present

## 2022-03-20 DIAGNOSIS — F209 Schizophrenia, unspecified: Secondary | ICD-10-CM | POA: Diagnosis not present

## 2022-03-20 DIAGNOSIS — E782 Mixed hyperlipidemia: Secondary | ICD-10-CM | POA: Diagnosis not present

## 2022-03-20 DIAGNOSIS — E039 Hypothyroidism, unspecified: Secondary | ICD-10-CM | POA: Diagnosis not present

## 2022-03-20 DIAGNOSIS — K219 Gastro-esophageal reflux disease without esophagitis: Secondary | ICD-10-CM | POA: Diagnosis not present

## 2022-04-03 ENCOUNTER — Other Ambulatory Visit: Payer: Self-pay | Admitting: Gastroenterology

## 2022-04-03 DIAGNOSIS — Z800 Family history of malignant neoplasm of digestive organs: Secondary | ICD-10-CM

## 2022-04-03 DIAGNOSIS — R195 Other fecal abnormalities: Secondary | ICD-10-CM | POA: Diagnosis not present

## 2022-04-03 DIAGNOSIS — Z1211 Encounter for screening for malignant neoplasm of colon: Secondary | ICD-10-CM | POA: Diagnosis not present

## 2022-04-23 ENCOUNTER — Encounter (HOSPITAL_COMMUNITY): Payer: Self-pay

## 2022-04-23 ENCOUNTER — Emergency Department (HOSPITAL_COMMUNITY)
Admission: EM | Admit: 2022-04-23 | Discharge: 2022-04-24 | Disposition: A | Discharge status: Home / Self Care | Payer: Medicare PPO | Attending: Emergency Medicine | Admitting: Emergency Medicine

## 2022-04-23 ENCOUNTER — Other Ambulatory Visit: Payer: Self-pay

## 2022-04-23 ENCOUNTER — Emergency Department (HOSPITAL_COMMUNITY): Payer: Medicare PPO

## 2022-04-23 DIAGNOSIS — M47812 Spondylosis without myelopathy or radiculopathy, cervical region: Secondary | ICD-10-CM | POA: Insufficient documentation

## 2022-04-23 DIAGNOSIS — S0990XA Unspecified injury of head, initial encounter: Secondary | ICD-10-CM | POA: Insufficient documentation

## 2022-04-23 DIAGNOSIS — S0993XA Unspecified injury of face, initial encounter: Secondary | ICD-10-CM | POA: Diagnosis present

## 2022-04-23 DIAGNOSIS — W01198A Fall on same level from slipping, tripping and stumbling with subsequent striking against other object, initial encounter: Secondary | ICD-10-CM | POA: Diagnosis not present

## 2022-04-23 DIAGNOSIS — S199XXA Unspecified injury of neck, initial encounter: Secondary | ICD-10-CM | POA: Diagnosis not present

## 2022-04-23 DIAGNOSIS — S0181XA Laceration without foreign body of other part of head, initial encounter: Secondary | ICD-10-CM

## 2022-04-23 DIAGNOSIS — J069 Acute upper respiratory infection, unspecified: Secondary | ICD-10-CM | POA: Diagnosis not present

## 2022-04-23 DIAGNOSIS — S01111A Laceration without foreign body of right eyelid and periocular area, initial encounter: Secondary | ICD-10-CM | POA: Insufficient documentation

## 2022-04-23 DIAGNOSIS — Z20822 Contact with and (suspected) exposure to covid-19: Secondary | ICD-10-CM | POA: Insufficient documentation

## 2022-04-23 DIAGNOSIS — R0981 Nasal congestion: Secondary | ICD-10-CM | POA: Diagnosis not present

## 2022-04-23 MED ORDER — LIDOCAINE-EPINEPHRINE 2 %-1:100000 IJ SOLN
20.0000 mL | Freq: Once | INTRAMUSCULAR | Status: DC
  Filled 2022-04-23: qty 1

## 2022-04-23 NOTE — ED Provider Notes (Incomplete)
[facility] DEPT Provider Note   CSN: [id number] Arrival date & time: 04/23/22  2047     History {Add pertinent medical, surgical, social history, OB history to HPI:1} Chief Complaint  Patient presents with  . Laceration    Shelby Howell is a 71 y.o. female.  Patient presents for evaluation of head injury after a fall.  Patient reports that she slipped going down steps and fell to the ground.  She did hit the right side of her head on the ground, no loss of consciousness.  Patient has laceration to the right eyebrow.  No other injuries noted.  She does report that she has had a runny nose lately and would like a COVID test.       Home Medications Prior to Admission medications   Medication Sig Start Date End Date Taking? Authorizing Provider  fluticasone (FLONASE) 50 MCG/ACT nasal spray Place 2 sprays into both nostrils daily. 11/19/14   Drenda Freeze, MD      Allergies    Penicillins    Review of Systems   Review of Systems  Physical Exam Updated Vital Signs BP (!) 140/81   Pulse 89   Temp 98.2 F (36.8 C)   Resp 15   SpO2 100%  Physical Exam Vitals and nursing note reviewed.  Constitutional:      General: She is not in acute distress.    Appearance: She is well-developed.  HENT:     Head: Normocephalic. Laceration present.      Mouth/Throat:     Mouth: Mucous membranes are moist.  Eyes:     General: Vision grossly intact. Gaze aligned appropriately.     Extraocular Movements: Extraocular movements intact.     Conjunctiva/sclera: Conjunctivae normal.  Cardiovascular:     Rate and Rhythm: Normal rate and regular rhythm.     Pulses: Normal pulses.     Heart sounds: Normal heart sounds, S1 normal and S2 normal. No murmur heard.    No friction rub. No gallop.  Pulmonary:     Effort: Pulmonary effort is normal. No respiratory distress.     Breath sounds: Normal breath sounds.  Abdominal:     General: Bowel sounds are  normal.     Palpations: Abdomen is soft.     Tenderness: There is no abdominal tenderness. There is no guarding or rebound.     Hernia: No hernia is present.  Musculoskeletal:        General: No swelling.     Cervical back: Full passive range of motion without pain, normal range of motion and neck supple. No spinous process tenderness or muscular tenderness. Normal range of motion.     Right lower leg: No edema.     Left lower leg: No edema.  Skin:    General: Skin is warm and dry.     Capillary Refill: Capillary refill takes less than 2 seconds.     Findings: No ecchymosis, erythema, rash or wound.  Neurological:     General: No focal deficit present.     Mental Status: She is alert and oriented to person, place, and time.     GCS: GCS eye subscore is 4. GCS verbal subscore is 5. GCS motor subscore is 6.     Cranial Nerves: Cranial nerves 2-12 are intact.     Sensory: Sensation is intact.     Motor: Motor function is intact.     Coordination: Coordination is intact.  Psychiatric:  Attention and Perception: Attention normal.        Mood and Affect: Mood normal.        Speech: Speech normal.        Behavior: Behavior normal.     ED Results / Procedures / Treatments   Labs (all labs ordered are listed, but only abnormal results are displayed) Labs Reviewed - No data to display  EKG None  Radiology CT Head Wo Contrast  Result Date: 04/23/2022 CLINICAL DATA:  Head trauma, minor (Age >= 65y); Neck trauma (Age >= 65y); Facial trauma, blunt. APPROX.2 cm laceration on right eyebrow after missing a step and falling down. Denies LOC / anticoagulants. EXAM: CT HEAD WITHOUT CONTRAST CT MAXILLOFACIAL WITHOUT CONTRAST CT CERVICAL SPINE WITHOUT CONTRAST TECHNIQUE: Multidetector CT imaging of the head, cervical spine, and maxillofacial structures were performed using the standard protocol without intravenous contrast. Multiplanar CT image reconstructions of the cervical spine and  maxillofacial structures were also generated. RADIATION DOSE REDUCTION: This exam was performed according to the departmental dose-optimization program which includes automated exposure control, adjustment of the mA and/or kV according to patient size and/or use of iterative reconstruction technique. COMPARISON:  X-ray cervical spine 02/20/2021, MRI cervical spine 05/18/2021 FINDINGS: CT HEAD FINDINGS Brain: No evidence of large-territorial acute infarction. No parenchymal hemorrhage. No mass lesion. No extra-axial collection. No mass effect or midline shift. No hydrocephalus. Basilar cisterns are patent. Vascular: No hyperdense vessel. Skull: No acute fracture or focal lesion. Other: None. CT MAXILLOFACIAL FINDINGS Osseous: No fracture or mandibular dislocation. No destructive process. Sinuses/Orbits: Bilateral ethmoid and right frontal mucosal thickening. Otherwise paranasal sinuses and mastoid air cells are clear. The orbits are unremarkable. Soft tissues: Negative. CT CERVICAL SPINE FINDINGS Alignment: Normal. 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 No severe osseous central canal stenosis. No acute fracture. No aggressive appearing focal osseous lesion or focal pathologic process. Soft tissues and spinal canal: No prevertebral fluid or swelling. No visible canal hematoma. Upper chest: Unremarkable. Other: Distal right clavicular cortical irregularity of unclear etiology. IMPRESSION: 1. No acute intracranial abnormality. 2. No acute displaced facial fracture. 3. No acute displaced fracture or traumatic listhesis of the cervical spine. 4. 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 Electronically Signed   By: Iven Finn M.D.   On: 04/23/2022 21:31   CT Cervical Spine Wo Contrast  Result Date: 04/23/2022 CLINICAL DATA:   Head trauma, minor (Age >= 65y); Neck trauma (Age >= 65y); Facial trauma, blunt. APPROX.2 cm laceration on right eyebrow after missing a step and falling down. Denies LOC / anticoagulants. EXAM: CT HEAD WITHOUT CONTRAST CT MAXILLOFACIAL WITHOUT CONTRAST CT CERVICAL SPINE WITHOUT CONTRAST TECHNIQUE: Multidetector CT imaging of the head, cervical spine, and maxillofacial structures were performed using the standard protocol without intravenous contrast. Multiplanar CT image reconstructions of the cervical spine and maxillofacial structures were also generated. RADIATION DOSE REDUCTION: This exam was performed according to the departmental dose-optimization program which includes automated exposure control, adjustment of the mA and/or kV according to patient size and/or use of iterative reconstruction technique. COMPARISON:  X-ray cervical spine 02/20/2021, MRI cervical spine 05/18/2021 FINDINGS: CT HEAD FINDINGS Brain: No evidence of large-territorial acute infarction. No parenchymal hemorrhage. No mass lesion. No extra-axial collection. No mass effect or midline shift. No hydrocephalus. Basilar cisterns are patent. Vascular: No hyperdense vessel. Skull: No acute fracture or focal lesion. Other: None. CT MAXILLOFACIAL FINDINGS Osseous:  No fracture or mandibular dislocation. No destructive process. Sinuses/Orbits: Bilateral ethmoid and right frontal mucosal thickening. Otherwise paranasal sinuses and mastoid air cells are clear. The orbits are unremarkable. Soft tissues: Negative. CT CERVICAL SPINE FINDINGS Alignment: Normal. 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 No severe osseous central canal stenosis. No acute fracture. No aggressive appearing focal osseous lesion or focal pathologic process. Soft tissues and spinal canal: No prevertebral fluid or swelling. No visible canal hematoma. Upper chest:  Unremarkable. Other: Distal right clavicular cortical irregularity of unclear etiology. IMPRESSION: 1. No acute intracranial abnormality. 2. No acute displaced facial fracture. 3. No acute displaced fracture or traumatic listhesis of the cervical spine. 4. 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 Electronically Signed   By: Iven Finn M.D.   On: 04/23/2022 21:31   CT Maxillofacial WO CM  Result Date: 04/23/2022 CLINICAL DATA:  Head trauma, minor (Age >= 65y); Neck trauma (Age >= 65y); Facial trauma, blunt. APPROX.2 cm laceration on right eyebrow after missing a step and falling down. Denies LOC / anticoagulants. EXAM: CT HEAD WITHOUT CONTRAST CT MAXILLOFACIAL WITHOUT CONTRAST CT CERVICAL SPINE WITHOUT CONTRAST TECHNIQUE: Multidetector CT imaging of the head, cervical spine, and maxillofacial structures were performed using the standard protocol without intravenous contrast. Multiplanar CT image reconstructions of the cervical spine and maxillofacial structures were also generated. RADIATION DOSE REDUCTION: This exam was performed according to the departmental dose-optimization program which includes automated exposure control, adjustment of the mA and/or kV according to patient size and/or use of iterative reconstruction technique. COMPARISON:  X-ray cervical spine 02/20/2021, MRI cervical spine 05/18/2021 FINDINGS: CT HEAD FINDINGS Brain: No evidence of large-territorial acute infarction. No parenchymal hemorrhage. No mass lesion. No extra-axial collection. No mass effect or midline shift. No hydrocephalus. Basilar cisterns are patent. Vascular: No hyperdense vessel. Skull: No acute fracture or focal lesion. Other: None. CT MAXILLOFACIAL FINDINGS Osseous: No fracture or mandibular dislocation. No destructive process. Sinuses/Orbits: Bilateral ethmoid and right frontal mucosal thickening. Otherwise paranasal sinuses and  mastoid air cells are clear. The orbits are unremarkable. Soft tissues: Negative. CT CERVICAL SPINE FINDINGS Alignment: Normal. 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 No severe osseous central canal stenosis. No acute fracture. No aggressive appearing focal osseous lesion or focal pathologic process. Soft tissues and spinal canal: No prevertebral fluid or swelling. No visible canal hematoma. Upper chest: Unremarkable. Other: Distal right clavicular cortical irregularity of unclear etiology. IMPRESSION: 1. No acute intracranial abnormality. 2. No acute displaced facial fracture. 3. No acute displaced fracture or traumatic listhesis of the cervical spine. 4. 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 Electronically Signed   By: Iven Finn M.D.   On: 04/23/2022 21:31    Procedures .Marland KitchenLaceration Repair  Date/Time: 04/23/2022 11:45 PM  Performed by: Orpah Greek, MD Authorized by: Orpah Greek, MD   Consent:    Consent obtained:  Verbal   Consent given by:  Patient   Risks, benefits, and alternatives were discussed: yes     Risks discussed:  Infection, pain and poor cosmetic result Universal protocol:    Procedure explained and questions answered to patient or proxy's satisfaction: yes     Relevant documents present and verified: yes     Test results available: yes     Imaging studies available:  yes     Required blood products, implants, devices, and special equipment available: yes     Site/side marked: yes     Immediately prior to procedure, a time out was called: yes     Patient identity confirmed:  Verbally with patient Anesthesia:    Anesthesia method:  Local infiltration   Local anesthetic:  Lidocaine 2% WITH epi Laceration details:    Location:  Face   Face location:  R  eyebrow   Length (cm):  2 Pre-procedure details:    Preparation:  Patient was prepped and draped in usual sterile fashion and imaging obtained to evaluate for foreign bodies Exploration:    Hemostasis achieved with:  Epinephrine   Contaminated: no   Treatment:    Area cleansed with:  Povidone-iodine   Amount of cleaning:  Standard   Irrigation solution:  Sterile saline   Irrigation method:  Syringe   Debridement:  None   Layers/structures repaired:  Deep subcutaneous Deep subcutaneous:    Suture size:  5-0   Suture material:  Plain gut   Suture technique:  Horizontal mattress   Number of sutures:  1 Skin repair:    Repair method:  Sutures   Suture size:  5-0   Suture material:  Fast-absorbing gut   Suture technique:  Simple interrupted   Number of sutures:  5 Approximation:    Approximation:  Close Repair type:    Repair type:  Simple Post-procedure details:    Dressing:  Open (no dressing)   Procedure completion:  Tolerated well, no immediate complications   {Document cardiac monitor, telemetry assessment procedure when appropriate:1}  Medications Ordered in ED Medications  lidocaine-EPINEPHrine (XYLOCAINE W/EPI) 2 %-1:100000 (with pres) injection 20 mL (has no administration in time range)    ED Course/ Medical Decision Making/ A&P                           Medical Decision Making Risk Prescription drug management.     {Document critical care time when appropriate:1} {Document review of labs and clinical decision tools ie heart score, Chads2Vasc2 etc:1}  {Document your independent review of radiology images, and any outside records:1} {Document your discussion with family members, caretakers, and with consultants:1} {Document social determinants of health affecting pt's care:1} {Document your decision making why or why not admission, treatments were needed:1} Final Clinical Impression(s) / ED Diagnoses Final diagnoses:  None    Rx / DC Orders ED  Discharge Orders     None

## 2022-04-23 NOTE — ED Provider Triage Note (Signed)
Emergency Medicine Provider Triage Evaluation Note  Shelby Howell , a 71 y.o. female  was evaluated in triage.  Pt complains of eyebrow laceration secondary to fall.  Patient states she tripped over a step falling face first onto concrete at approximately 6 PM.  She states she feels that she almost lost consciousness.  She has an approximately 2 cm laceration in the right eyebrow.  She denies any syncope prior to the fall.  She feels certain that the fall was mechanical.  Bleeding is controlled at this time Review of Systems  Positive: As above Negative: As above  Physical Exam  There were no vitals taken for this visit. Gen:   Awake, no distress   Resp:  Normal effort  MSK:   Moves extremities without difficulty  Other:  2 cm laceration in right eyebrow, bleeding controlled  Medical Decision Making  Medically screening exam initiated at 9:00 PM.  Appropriate orders placed.  Shelby Howell was informed that the remainder of the evaluation will be completed by another provider, this initial triage assessment does not replace that evaluation, and the importance of remaining in the ED until their evaluation is complete.     Dorothyann Peng, PA-C 04/23/22 2105

## 2022-04-23 NOTE — ED Provider Notes (Signed)
[facility] DEPT Provider Note   CSN: [id number] Arrival date & time: 04/23/22  2047     History {Add pertinent medical, surgical, social history, OB history to HPI:1} Chief Complaint  Patient presents with   Laceration    Shelby Howell is a 71 y.o. female.  Patient presents for evaluation of head injury after a fall.  Patient reports that she slipped going down steps and fell to the ground.  She did hit the right side of her head on the ground, no loss of consciousness.  Patient has laceration to the right eyebrow.  No other injuries noted.  She does report that she has had a runny nose lately and would like a COVID test.       Home Medications Prior to Admission medications   Medication Sig Start Date End Date Taking? Authorizing Provider  fluticasone (FLONASE) 50 MCG/ACT nasal spray Place 2 sprays into both nostrils daily. 11/19/14   Drenda Freeze, MD      Allergies    Penicillins    Review of Systems   Review of Systems  Physical Exam Updated Vital Signs BP (!) 140/81   Pulse 89   Temp 98.2 F (36.8 C)   Resp 15   SpO2 100%  Physical Exam Vitals and nursing note reviewed.  Constitutional:      General: She is not in acute distress.    Appearance: She is well-developed.  HENT:     Head: Normocephalic. Laceration present.      Mouth/Throat:     Mouth: Mucous membranes are moist.  Eyes:     General: Vision grossly intact. Gaze aligned appropriately.     Extraocular Movements: Extraocular movements intact.     Conjunctiva/sclera: Conjunctivae normal.  Cardiovascular:     Rate and Rhythm: Normal rate and regular rhythm.     Pulses: Normal pulses.     Heart sounds: Normal heart sounds, S1 normal and S2 normal. No murmur heard.    No friction rub. No gallop.  Pulmonary:     Effort: Pulmonary effort is normal. No respiratory distress.     Breath sounds: Normal breath sounds.  Abdominal:     General: Bowel sounds are  normal.     Palpations: Abdomen is soft.     Tenderness: There is no abdominal tenderness. There is no guarding or rebound.     Hernia: No hernia is present.  Musculoskeletal:        General: No swelling.     Cervical back: Full passive range of motion without pain, normal range of motion and neck supple. No spinous process tenderness or muscular tenderness. Normal range of motion.     Right lower leg: No edema.     Left lower leg: No edema.  Skin:    General: Skin is warm and dry.     Capillary Refill: Capillary refill takes less than 2 seconds.     Findings: No ecchymosis, erythema, rash or wound.  Neurological:     General: No focal deficit present.     Mental Status: She is alert and oriented to person, place, and time.     GCS: GCS eye subscore is 4. GCS verbal subscore is 5. GCS motor subscore is 6.     Cranial Nerves: Cranial nerves 2-12 are intact.     Sensory: Sensation is intact.     Motor: Motor function is intact.     Coordination: Coordination is intact.  Psychiatric:  Attention and Perception: Attention normal.        Mood and Affect: Mood normal.        Speech: Speech normal.        Behavior: Behavior normal.     ED Results / Procedures / Treatments   Labs (all labs ordered are listed, but only abnormal results are displayed) Labs Reviewed - No data to display  EKG None  Radiology CT Head Wo Contrast  Result Date: 04/23/2022 CLINICAL DATA:  Head trauma, minor (Age >= 65y); Neck trauma (Age >= 65y); Facial trauma, blunt. APPROX.2 cm laceration on right eyebrow after missing a step and falling down. Denies LOC / anticoagulants. EXAM: CT HEAD WITHOUT CONTRAST CT MAXILLOFACIAL WITHOUT CONTRAST CT CERVICAL SPINE WITHOUT CONTRAST TECHNIQUE: Multidetector CT imaging of the head, cervical spine, and maxillofacial structures were performed using the standard protocol without intravenous contrast. Multiplanar CT image reconstructions of the cervical spine and  maxillofacial structures were also generated. RADIATION DOSE REDUCTION: This exam was performed according to the departmental dose-optimization program which includes automated exposure control, adjustment of the mA and/or kV according to patient size and/or use of iterative reconstruction technique. COMPARISON:  X-ray cervical spine 02/20/2021, MRI cervical spine 05/18/2021 FINDINGS: CT HEAD FINDINGS Brain: No evidence of large-territorial acute infarction. No parenchymal hemorrhage. No mass lesion. No extra-axial collection. No mass effect or midline shift. No hydrocephalus. Basilar cisterns are patent. Vascular: No hyperdense vessel. Skull: No acute fracture or focal lesion. Other: None. CT MAXILLOFACIAL FINDINGS Osseous: No fracture or mandibular dislocation. No destructive process. Sinuses/Orbits: Bilateral ethmoid and right frontal mucosal thickening. Otherwise paranasal sinuses and mastoid air cells are clear. The orbits are unremarkable. Soft tissues: Negative. CT CERVICAL SPINE FINDINGS Alignment: Normal. 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 No severe osseous central canal stenosis. No acute fracture. No aggressive appearing focal osseous lesion or focal pathologic process. Soft tissues and spinal canal: No prevertebral fluid or swelling. No visible canal hematoma. Upper chest: Unremarkable. Other: Distal right clavicular cortical irregularity of unclear etiology. IMPRESSION: 1. No acute intracranial abnormality. 2. No acute displaced facial fracture. 3. No acute displaced fracture or traumatic listhesis of the cervical spine. 4. 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 Electronically Signed   By: Iven Finn M.D.   On: 04/23/2022 21:31   CT Cervical Spine Wo Contrast  Result Date: 04/23/2022 CLINICAL DATA:   Head trauma, minor (Age >= 65y); Neck trauma (Age >= 65y); Facial trauma, blunt. APPROX.2 cm laceration on right eyebrow after missing a step and falling down. Denies LOC / anticoagulants. EXAM: CT HEAD WITHOUT CONTRAST CT MAXILLOFACIAL WITHOUT CONTRAST CT CERVICAL SPINE WITHOUT CONTRAST TECHNIQUE: Multidetector CT imaging of the head, cervical spine, and maxillofacial structures were performed using the standard protocol without intravenous contrast. Multiplanar CT image reconstructions of the cervical spine and maxillofacial structures were also generated. RADIATION DOSE REDUCTION: This exam was performed according to the departmental dose-optimization program which includes automated exposure control, adjustment of the mA and/or kV according to patient size and/or use of iterative reconstruction technique. COMPARISON:  X-ray cervical spine 02/20/2021, MRI cervical spine 05/18/2021 FINDINGS: CT HEAD FINDINGS Brain: No evidence of large-territorial acute infarction. No parenchymal hemorrhage. No mass lesion. No extra-axial collection. No mass effect or midline shift. No hydrocephalus. Basilar cisterns are patent. Vascular: No hyperdense vessel. Skull: No acute fracture or focal lesion. Other: None. CT MAXILLOFACIAL FINDINGS Osseous:  No fracture or mandibular dislocation. No destructive process. Sinuses/Orbits: Bilateral ethmoid and right frontal mucosal thickening. Otherwise paranasal sinuses and mastoid air cells are clear. The orbits are unremarkable. Soft tissues: Negative. CT CERVICAL SPINE FINDINGS Alignment: Normal. 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 No severe osseous central canal stenosis. No acute fracture. No aggressive appearing focal osseous lesion or focal pathologic process. Soft tissues and spinal canal: No prevertebral fluid or swelling. No visible canal hematoma. Upper chest:  Unremarkable. Other: Distal right clavicular cortical irregularity of unclear etiology. IMPRESSION: 1. No acute intracranial abnormality. 2. No acute displaced facial fracture. 3. No acute displaced fracture or traumatic listhesis of the cervical spine. 4. 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 Electronically Signed   By: Iven Finn M.D.   On: 04/23/2022 21:31   CT Maxillofacial WO CM  Result Date: 04/23/2022 CLINICAL DATA:  Head trauma, minor (Age >= 65y); Neck trauma (Age >= 65y); Facial trauma, blunt. APPROX.2 cm laceration on right eyebrow after missing a step and falling down. Denies LOC / anticoagulants. EXAM: CT HEAD WITHOUT CONTRAST CT MAXILLOFACIAL WITHOUT CONTRAST CT CERVICAL SPINE WITHOUT CONTRAST TECHNIQUE: Multidetector CT imaging of the head, cervical spine, and maxillofacial structures were performed using the standard protocol without intravenous contrast. Multiplanar CT image reconstructions of the cervical spine and maxillofacial structures were also generated. RADIATION DOSE REDUCTION: This exam was performed according to the departmental dose-optimization program which includes automated exposure control, adjustment of the mA and/or kV according to patient size and/or use of iterative reconstruction technique. COMPARISON:  X-ray cervical spine 02/20/2021, MRI cervical spine 05/18/2021 FINDINGS: CT HEAD FINDINGS Brain: No evidence of large-territorial acute infarction. No parenchymal hemorrhage. No mass lesion. No extra-axial collection. No mass effect or midline shift. No hydrocephalus. Basilar cisterns are patent. Vascular: No hyperdense vessel. Skull: No acute fracture or focal lesion. Other: None. CT MAXILLOFACIAL FINDINGS Osseous: No fracture or mandibular dislocation. No destructive process. Sinuses/Orbits: Bilateral ethmoid and right frontal mucosal thickening. Otherwise paranasal sinuses and  mastoid air cells are clear. The orbits are unremarkable. Soft tissues: Negative. CT CERVICAL SPINE FINDINGS Alignment: Normal. 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 No severe osseous central canal stenosis. No acute fracture. No aggressive appearing focal osseous lesion or focal pathologic process. Soft tissues and spinal canal: No prevertebral fluid or swelling. No visible canal hematoma. Upper chest: Unremarkable. Other: Distal right clavicular cortical irregularity of unclear etiology. IMPRESSION: 1. No acute intracranial abnormality. 2. No acute displaced facial fracture. 3. No acute displaced fracture or traumatic listhesis of the cervical spine. 4. 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 Electronically Signed   By: Iven Finn M.D.   On: 04/23/2022 21:31    Procedures Procedures  {Document cardiac monitor, telemetry assessment procedure when appropriate:1}  Medications Ordered in ED Medications  lidocaine-EPINEPHrine (XYLOCAINE W/EPI) 2 %-1:100000 (with pres) injection 20 mL (has no administration in time range)    ED Course/ Medical Decision Making/ A&P                           Medical Decision Making Risk Prescription drug management.   ***  {Document critical care time when appropriate:1} {Document review of labs and clinical decision tools ie heart score, Chads2Vasc2 etc:1}  {Document  your independent review of radiology images, and any outside records:1} {Document your discussion with family members, caretakers, and with consultants:1} {Document social determinants of health affecting pt's care:1} {Document your decision making why or why not admission, treatments were needed:1} Final Clinical Impression(s) / ED Diagnoses Final diagnoses:  None    Rx / DC Orders ED Discharge  Orders     None

## 2022-04-23 NOTE — ED Triage Notes (Signed)
~  2 cm laceration on right eyebrow after missing a step and falling down. Denies LOC / anticoagulants.

## 2022-04-24 LAB — SARS CORONAVIRUS 2 BY RT PCR: SARS Coronavirus 2 by RT PCR: NEGATIVE

## 2022-04-25 ENCOUNTER — Ambulatory Visit
Admission: RE | Admit: 2022-04-25 | Discharge: 2022-04-25 | Disposition: A | Discharge status: Home / Self Care | Payer: Medicare PPO | Source: Ambulatory Visit | Attending: Gastroenterology | Admitting: Gastroenterology

## 2022-04-25 DIAGNOSIS — Z800 Family history of malignant neoplasm of digestive organs: Secondary | ICD-10-CM

## 2022-04-25 DIAGNOSIS — R160 Hepatomegaly, not elsewhere classified: Secondary | ICD-10-CM | POA: Diagnosis not present

## 2022-04-25 DIAGNOSIS — N281 Cyst of kidney, acquired: Secondary | ICD-10-CM | POA: Diagnosis not present

## 2022-04-25 MED ORDER — GADOPICLENOL 0.5 MMOL/ML IV SOLN
7.0000 mL | Freq: Once | INTRAVENOUS | Status: AC | PRN
  Administered 2022-04-25: 7 mL via INTRAVENOUS

## 2022-05-17 DIAGNOSIS — M65331 Trigger finger, right middle finger: Secondary | ICD-10-CM | POA: Diagnosis not present

## 2022-05-17 DIAGNOSIS — M65332 Trigger finger, left middle finger: Secondary | ICD-10-CM | POA: Diagnosis not present

## 2022-05-17 DIAGNOSIS — M1811 Unilateral primary osteoarthritis of first carpometacarpal joint, right hand: Secondary | ICD-10-CM | POA: Diagnosis not present

## 2022-05-23 DIAGNOSIS — F250 Schizoaffective disorder, bipolar type: Secondary | ICD-10-CM | POA: Diagnosis not present

## 2022-05-26 IMAGING — MR MR ABDOMEN WO/W CM MRCP
11 of 18 series · 26 of 48 positions shown · IV contrast (15ml Multihance)
Comparison: 03/10/2019

CLINICAL DATA: Family history of pancreatic cancer

EXAM:
MRI ABDOMEN WITHOUT AND WITH CONTRAST (INCLUDING MRCP)
TECHNIQUE: Multiplanar multisequence MR imaging of the abdomen was performed
both before and after the administration of intravenous contrast.
Heavily T2-weighted images of the biliary and pancreatic ducts were
obtained, and three-dimensional MRCP images were rendered by post
processing.
CONTRAST:  14mL MULTIHANCE GADOBENATE DIMEGLUMINE 529 MG/ML IV SOLN

[Series 2: cor haste · coronal · 5.0mm · 0.70mm/px · 2 of 32 slices shown]
[im 1/32]
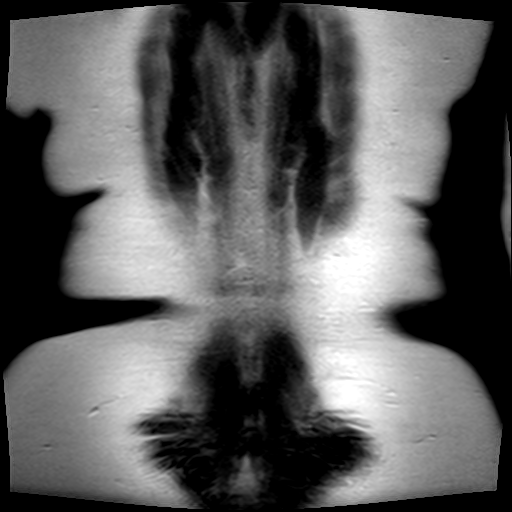
[im 32/32]
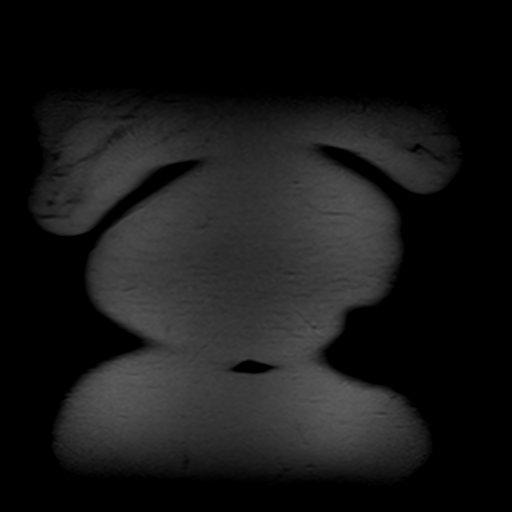

[Series 3: axial haste · axial · 6.0mm · 0.70mm/px · z∈[-88,+124]mm · 2 of 33 slices shown]
[im 1/33]
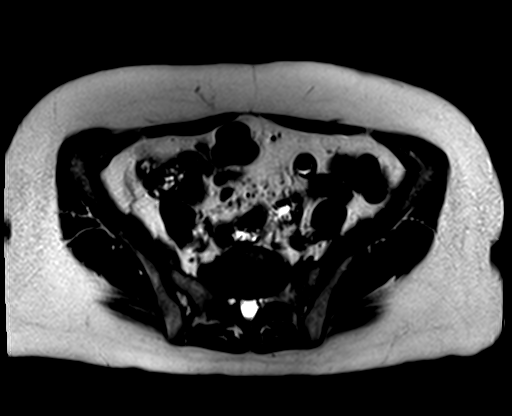
[im 33/33]
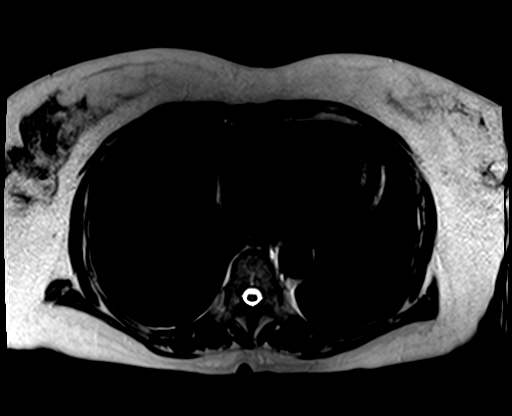

[Series 4: T1 · axial · 6.0mm · 0.70mm/px · z∈[-88,+124]mm · 3 of 66 slices shown]
[im 1/66]
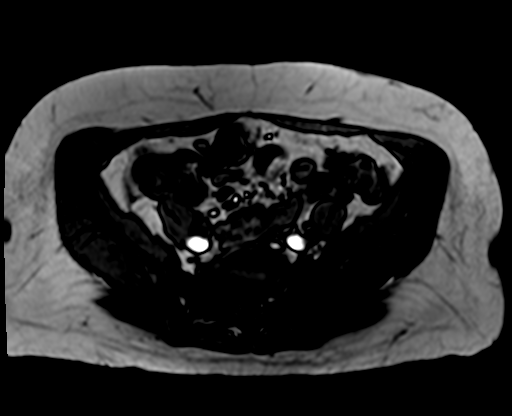
[im 33/66]
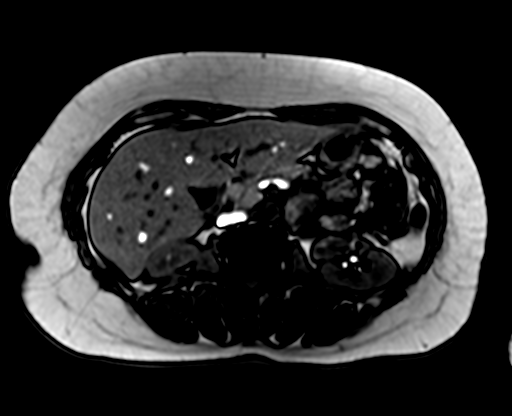
[im 66/66]
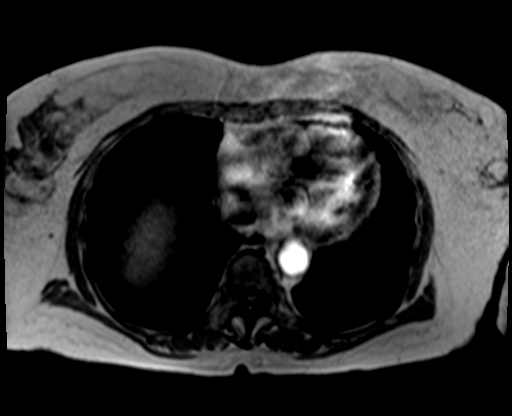

[Series 5: T2 · coronal · 3.0mm · 0.70mm/px · 3 of 48 slices shown (1 of 2)]
[im 1/48]
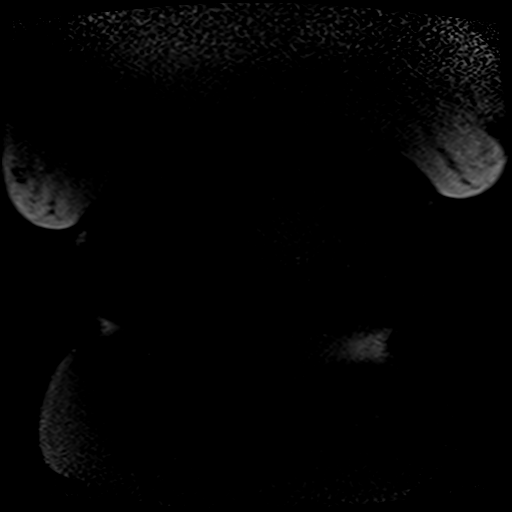
[im 24/48]
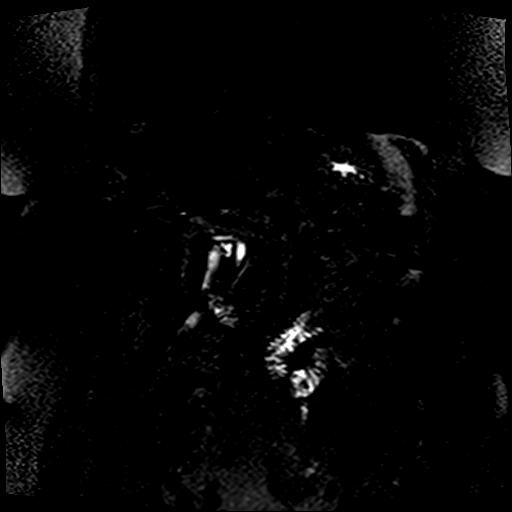
[im 48/48]
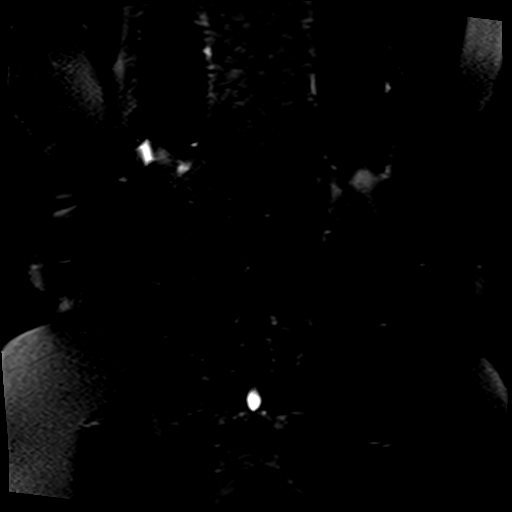

[Series 6: T2 · axial · 6.0mm · 1.12mm/px · 1 of 38 slices shown (2 of 2)]
[im 1/38]
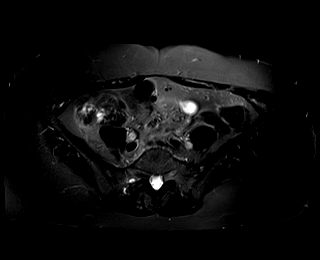

[Series 7: [id number]_trig · axial · 6.0mm · 1.88mm/px · z∈[-75,+170]mm · 4 of 114 slices shown]
[im 1/114]
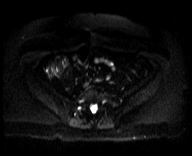
[im 38/114]
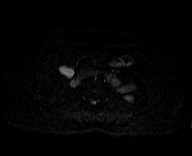
[im 76/114]
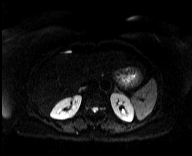
[im 114/114]
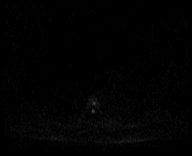

[Series 8: [id number]_trig_adc · axial · 6.0mm · 1.88mm/px · 1 of 38 slices shown]
[im 1/38]
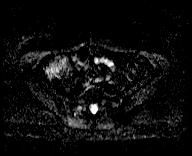

[Series 13: T1 dynamic · axial · non-contrast · 2.5mm · 0.70mm/px · z∈[-95,+143]mm · 3 of 96 slices shown]
[im 1/96]
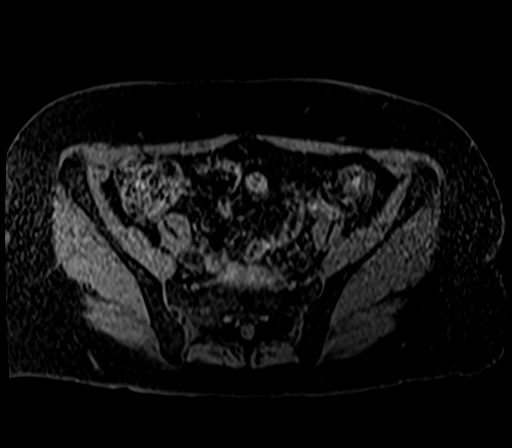
[im 48/96]
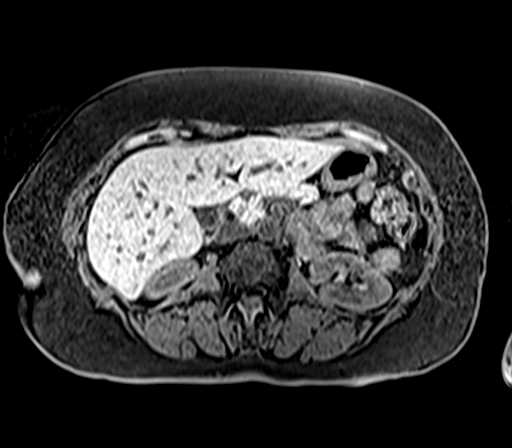
[im 96/96]
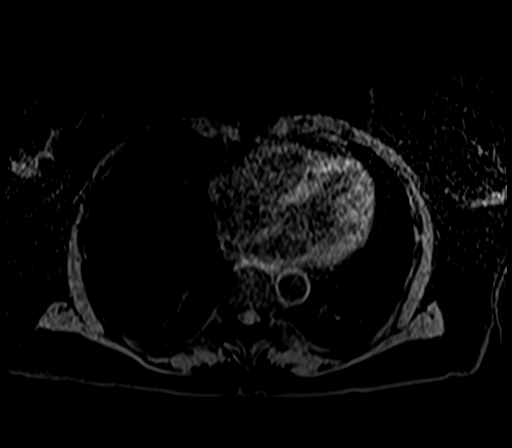

[Series 14: T1 dynamic post-contrast · axial · 2.5mm · 0.70mm/px · z∈[-95,+143]mm · 3 of 96 slices shown (1 of 3)]
[im 1/96]
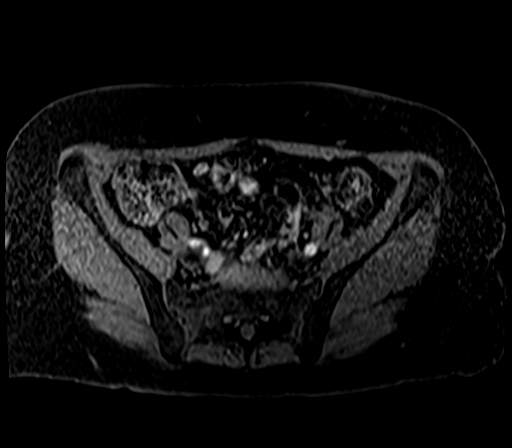
[im 48/96]
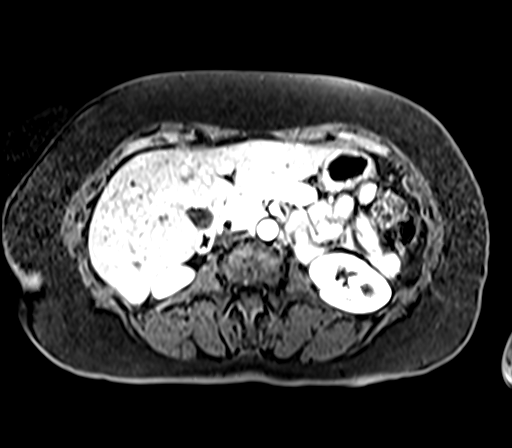
[im 96/96]
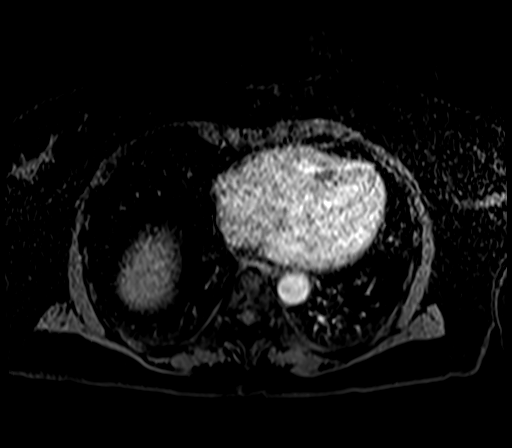

[Series 15: T1 dynamic post-contrast · axial · 2.5mm · 0.70mm/px · z∈[-95,+143]mm · 3 of 96 slices shown (2 of 3)]
[im 1/96]
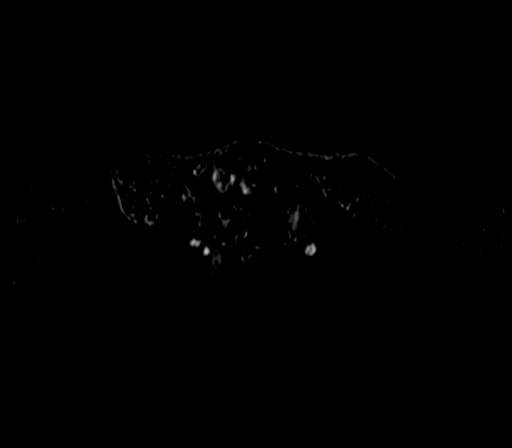
[im 48/96]
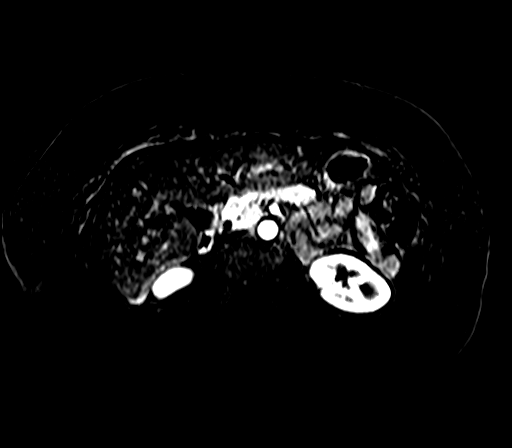
[im 96/96]
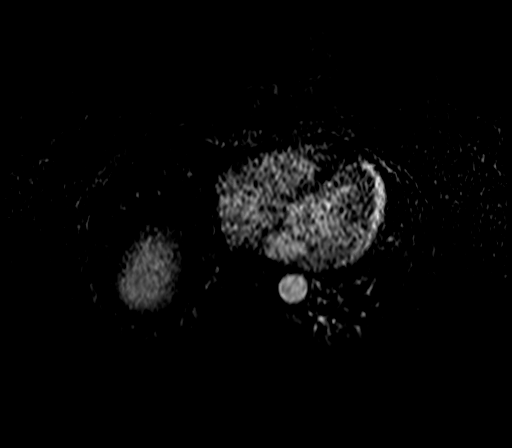

[Series 16: T1 dynamic post-contrast · axial · 2.5mm · 0.70mm/px · 1 of 96 slices shown (3 of 3)]
[im 1/96]
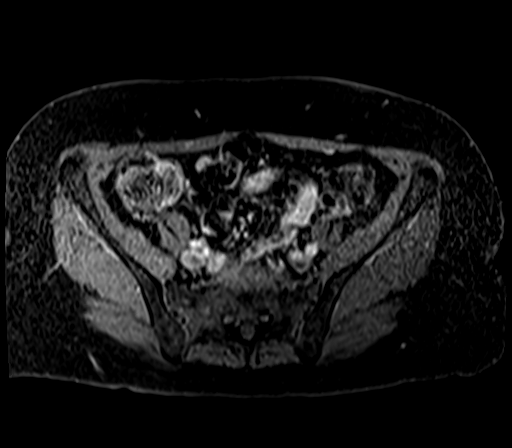

[26 of 48 positions shown; findings below may reference images not displayed]

FINDINGS: Lower chest: Small type 1 hiatal hernia.

Hepatobiliary: Stable left hepatic lobe hemangioma 1.4 by 1.0 cm on
image 41 of series 21. Stable 3 mm hemangioma in segment 7 of the
liver shown on image 6 of series [DATE] by 0.3 cm flash filling
hemangioma in segment 2 of the liver on image 12 of series 6.

The gallbladder and biliary tree appear unremarkable.

Pancreas:  Unremarkable

Spleen:  Unremarkable

Adrenals/Urinary Tract: Stable small right renal cysts. Adrenal
glands normal.

Stomach/Bowel: Prominent stool throughout the colon favors
constipation.

Vascular/Lymphatic:  Unremarkable

Other: Trace fluid between the right anterior diaphragm and the
right anterior capsular margin of the liver on image 14 series 6, no
local abnormal enhancement or other specific abnormality in this
vicinity.

Musculoskeletal: Lumbar spondylosis and degenerative disc disease.
IMPRESSION: 1. No pancreatic mass is identified.
2. Prominent stool throughout the colon favors constipation.
3. Stable small hemangiomas in the liver.
4. Stable small right renal cysts.
5. Trace fluid between the right anterior diaphragm and the right
anterior capsular margin of the liver, no local abnormal enhancement
or other specific abnormality in this vicinity.
6. Small type 1 hiatal hernia.
7. Lumbar spondylosis and degenerative disc disease.

## 2022-06-19 DIAGNOSIS — M65331 Trigger finger, right middle finger: Secondary | ICD-10-CM | POA: Diagnosis not present

## 2022-06-19 DIAGNOSIS — M180 Bilateral primary osteoarthritis of first carpometacarpal joints: Secondary | ICD-10-CM | POA: Diagnosis not present

## 2022-06-19 DIAGNOSIS — M65332 Trigger finger, left middle finger: Secondary | ICD-10-CM | POA: Diagnosis not present

## 2022-06-19 DIAGNOSIS — M654 Radial styloid tenosynovitis [de Quervain]: Secondary | ICD-10-CM | POA: Diagnosis not present

## 2022-07-10 DIAGNOSIS — M180 Bilateral primary osteoarthritis of first carpometacarpal joints: Secondary | ICD-10-CM | POA: Diagnosis not present

## 2022-07-10 DIAGNOSIS — M1812 Unilateral primary osteoarthritis of first carpometacarpal joint, left hand: Secondary | ICD-10-CM | POA: Diagnosis not present

## 2022-07-23 DIAGNOSIS — R109 Unspecified abdominal pain: Secondary | ICD-10-CM | POA: Diagnosis not present

## 2022-07-23 DIAGNOSIS — R197 Diarrhea, unspecified: Secondary | ICD-10-CM | POA: Diagnosis not present

## 2022-08-03 DIAGNOSIS — R110 Nausea: Secondary | ICD-10-CM | POA: Diagnosis not present

## 2022-08-03 DIAGNOSIS — R109 Unspecified abdominal pain: Secondary | ICD-10-CM | POA: Diagnosis not present

## 2022-08-03 DIAGNOSIS — R195 Other fecal abnormalities: Secondary | ICD-10-CM | POA: Diagnosis not present

## 2022-08-03 DIAGNOSIS — Z800 Family history of malignant neoplasm of digestive organs: Secondary | ICD-10-CM | POA: Diagnosis not present

## 2022-09-25 DIAGNOSIS — Z0000 Encounter for general adult medical examination without abnormal findings: Secondary | ICD-10-CM | POA: Diagnosis not present

## 2022-09-25 DIAGNOSIS — R7303 Prediabetes: Secondary | ICD-10-CM | POA: Diagnosis not present

## 2022-09-25 DIAGNOSIS — E782 Mixed hyperlipidemia: Secondary | ICD-10-CM | POA: Diagnosis not present

## 2022-09-25 DIAGNOSIS — Z1389 Encounter for screening for other disorder: Secondary | ICD-10-CM | POA: Diagnosis not present

## 2022-09-25 DIAGNOSIS — I10 Essential (primary) hypertension: Secondary | ICD-10-CM | POA: Diagnosis not present

## 2022-09-25 DIAGNOSIS — E2839 Other primary ovarian failure: Secondary | ICD-10-CM | POA: Diagnosis not present

## 2022-09-25 DIAGNOSIS — M5090 Cervical disc disorder, unspecified, unspecified cervical region: Secondary | ICD-10-CM | POA: Diagnosis not present

## 2022-09-25 DIAGNOSIS — F209 Schizophrenia, unspecified: Secondary | ICD-10-CM | POA: Diagnosis not present

## 2022-09-25 DIAGNOSIS — E039 Hypothyroidism, unspecified: Secondary | ICD-10-CM | POA: Diagnosis not present

## 2022-10-02 DIAGNOSIS — H1045 Other chronic allergic conjunctivitis: Secondary | ICD-10-CM | POA: Diagnosis not present

## 2022-10-02 DIAGNOSIS — J3081 Allergic rhinitis due to animal (cat) (dog) hair and dander: Secondary | ICD-10-CM | POA: Diagnosis not present

## 2022-10-02 DIAGNOSIS — J3089 Other allergic rhinitis: Secondary | ICD-10-CM | POA: Diagnosis not present

## 2022-10-02 DIAGNOSIS — L509 Urticaria, unspecified: Secondary | ICD-10-CM | POA: Diagnosis not present

## 2022-11-06 ENCOUNTER — Other Ambulatory Visit: Payer: Self-pay | Admitting: Gastroenterology

## 2022-11-06 DIAGNOSIS — R112 Nausea with vomiting, unspecified: Secondary | ICD-10-CM

## 2022-11-06 DIAGNOSIS — R109 Unspecified abdominal pain: Secondary | ICD-10-CM | POA: Diagnosis not present

## 2022-11-06 DIAGNOSIS — Z800 Family history of malignant neoplasm of digestive organs: Secondary | ICD-10-CM | POA: Diagnosis not present

## 2022-11-06 DIAGNOSIS — R110 Nausea: Secondary | ICD-10-CM | POA: Diagnosis not present

## 2022-11-06 DIAGNOSIS — K219 Gastro-esophageal reflux disease without esophagitis: Secondary | ICD-10-CM | POA: Diagnosis not present

## 2022-11-12 DIAGNOSIS — K2930 Chronic superficial gastritis without bleeding: Secondary | ICD-10-CM | POA: Diagnosis not present

## 2022-11-12 DIAGNOSIS — B9681 Helicobacter pylori [H. pylori] as the cause of diseases classified elsewhere: Secondary | ICD-10-CM | POA: Diagnosis not present

## 2022-11-12 DIAGNOSIS — R110 Nausea: Secondary | ICD-10-CM | POA: Diagnosis not present

## 2022-11-12 DIAGNOSIS — K2970 Gastritis, unspecified, without bleeding: Secondary | ICD-10-CM | POA: Diagnosis not present

## 2022-11-12 DIAGNOSIS — K31A19 Gastric intestinal metaplasia without dysplasia, unspecified site: Secondary | ICD-10-CM | POA: Diagnosis not present

## 2022-11-15 ENCOUNTER — Other Ambulatory Visit (HOSPITAL_COMMUNITY): Payer: Self-pay | Admitting: Gastroenterology

## 2022-11-15 DIAGNOSIS — R112 Nausea with vomiting, unspecified: Secondary | ICD-10-CM

## 2022-11-15 DIAGNOSIS — R933 Abnormal findings on diagnostic imaging of other parts of digestive tract: Secondary | ICD-10-CM

## 2022-11-19 DIAGNOSIS — H1045 Other chronic allergic conjunctivitis: Secondary | ICD-10-CM | POA: Diagnosis not present

## 2022-11-19 DIAGNOSIS — J3081 Allergic rhinitis due to animal (cat) (dog) hair and dander: Secondary | ICD-10-CM | POA: Diagnosis not present

## 2022-11-19 DIAGNOSIS — L509 Urticaria, unspecified: Secondary | ICD-10-CM | POA: Diagnosis not present

## 2022-11-19 DIAGNOSIS — J3089 Other allergic rhinitis: Secondary | ICD-10-CM | POA: Diagnosis not present

## 2022-11-22 ENCOUNTER — Encounter (HOSPITAL_COMMUNITY)
Admission: RE | Admit: 2022-11-22 | Discharge: 2022-11-22 | Disposition: A | Discharge status: Home / Self Care | Payer: Medicare PPO | Source: Ambulatory Visit | Attending: Gastroenterology | Admitting: Gastroenterology

## 2022-11-22 DIAGNOSIS — R933 Abnormal findings on diagnostic imaging of other parts of digestive tract: Secondary | ICD-10-CM | POA: Diagnosis not present

## 2022-11-22 DIAGNOSIS — R112 Nausea with vomiting, unspecified: Secondary | ICD-10-CM | POA: Diagnosis not present

## 2022-11-22 DIAGNOSIS — R110 Nausea: Secondary | ICD-10-CM | POA: Diagnosis not present

## 2022-11-22 MED ORDER — TECHNETIUM TC 99M SULFUR COLLOID
2.0000 | Freq: Once | INTRAVENOUS | Status: AC | PRN
  Administered 2022-11-22: 2.1 via INTRAVENOUS

## 2022-11-28 DIAGNOSIS — H40023 Open angle with borderline findings, high risk, bilateral: Secondary | ICD-10-CM | POA: Diagnosis not present

## 2022-11-28 DIAGNOSIS — H524 Presbyopia: Secondary | ICD-10-CM | POA: Diagnosis not present

## 2022-11-28 DIAGNOSIS — H25813 Combined forms of age-related cataract, bilateral: Secondary | ICD-10-CM | POA: Diagnosis not present

## 2022-11-28 DIAGNOSIS — H04123 Dry eye syndrome of bilateral lacrimal glands: Secondary | ICD-10-CM | POA: Diagnosis not present

## 2022-12-07 ENCOUNTER — Ambulatory Visit
Admission: RE | Admit: 2022-12-07 | Discharge: 2022-12-07 | Disposition: A | Discharge status: Home / Self Care | Payer: Medicare PPO | Source: Ambulatory Visit | Attending: Gastroenterology | Admitting: Gastroenterology

## 2022-12-07 DIAGNOSIS — K769 Liver disease, unspecified: Secondary | ICD-10-CM | POA: Diagnosis not present

## 2022-12-07 DIAGNOSIS — R112 Nausea with vomiting, unspecified: Secondary | ICD-10-CM

## 2022-12-10 DIAGNOSIS — M1611 Unilateral primary osteoarthritis, right hip: Secondary | ICD-10-CM | POA: Diagnosis not present

## 2022-12-10 DIAGNOSIS — Z96641 Presence of right artificial hip joint: Secondary | ICD-10-CM | POA: Diagnosis not present

## 2022-12-10 DIAGNOSIS — M545 Low back pain, unspecified: Secondary | ICD-10-CM | POA: Diagnosis not present

## 2022-12-18 DIAGNOSIS — M5416 Radiculopathy, lumbar region: Secondary | ICD-10-CM | POA: Diagnosis not present

## 2023-02-11 DIAGNOSIS — N958 Other specified menopausal and perimenopausal disorders: Secondary | ICD-10-CM | POA: Diagnosis not present

## 2023-02-11 DIAGNOSIS — Z1231 Encounter for screening mammogram for malignant neoplasm of breast: Secondary | ICD-10-CM | POA: Diagnosis not present

## 2023-02-11 DIAGNOSIS — E349 Endocrine disorder, unspecified: Secondary | ICD-10-CM | POA: Diagnosis not present

## 2023-02-11 DIAGNOSIS — Z96641 Presence of right artificial hip joint: Secondary | ICD-10-CM | POA: Diagnosis not present

## 2023-03-27 DIAGNOSIS — R7303 Prediabetes: Secondary | ICD-10-CM | POA: Diagnosis not present

## 2023-03-27 DIAGNOSIS — E039 Hypothyroidism, unspecified: Secondary | ICD-10-CM | POA: Diagnosis not present

## 2023-03-27 DIAGNOSIS — E782 Mixed hyperlipidemia: Secondary | ICD-10-CM | POA: Diagnosis not present

## 2023-03-27 DIAGNOSIS — I10 Essential (primary) hypertension: Secondary | ICD-10-CM | POA: Diagnosis not present

## 2023-03-27 DIAGNOSIS — M5090 Cervical disc disorder, unspecified, unspecified cervical region: Secondary | ICD-10-CM | POA: Diagnosis not present

## 2023-03-27 DIAGNOSIS — K219 Gastro-esophageal reflux disease without esophagitis: Secondary | ICD-10-CM | POA: Diagnosis not present

## 2023-03-27 DIAGNOSIS — J309 Allergic rhinitis, unspecified: Secondary | ICD-10-CM | POA: Diagnosis not present

## 2023-03-27 DIAGNOSIS — F209 Schizophrenia, unspecified: Secondary | ICD-10-CM | POA: Diagnosis not present

## 2023-04-17 DIAGNOSIS — F250 Schizoaffective disorder, bipolar type: Secondary | ICD-10-CM | POA: Diagnosis not present

## 2023-07-08 DIAGNOSIS — R109 Unspecified abdominal pain: Secondary | ICD-10-CM | POA: Diagnosis not present

## 2023-07-08 DIAGNOSIS — K219 Gastro-esophageal reflux disease without esophagitis: Secondary | ICD-10-CM | POA: Diagnosis not present

## 2023-07-08 DIAGNOSIS — K31A0 Gastric intestinal metaplasia, unspecified: Secondary | ICD-10-CM | POA: Diagnosis not present

## 2023-07-08 DIAGNOSIS — Z800 Family history of malignant neoplasm of digestive organs: Secondary | ICD-10-CM | POA: Diagnosis not present

## 2023-08-02 DIAGNOSIS — M1612 Unilateral primary osteoarthritis, left hip: Secondary | ICD-10-CM | POA: Diagnosis not present

## 2023-08-02 DIAGNOSIS — M7062 Trochanteric bursitis, left hip: Secondary | ICD-10-CM | POA: Diagnosis not present

## 2023-10-08 DIAGNOSIS — J309 Allergic rhinitis, unspecified: Secondary | ICD-10-CM | POA: Diagnosis not present

## 2023-10-08 DIAGNOSIS — Z1331 Encounter for screening for depression: Secondary | ICD-10-CM | POA: Diagnosis not present

## 2023-10-08 DIAGNOSIS — R7303 Prediabetes: Secondary | ICD-10-CM | POA: Diagnosis not present

## 2023-10-08 DIAGNOSIS — E039 Hypothyroidism, unspecified: Secondary | ICD-10-CM | POA: Diagnosis not present

## 2023-10-08 DIAGNOSIS — Z0000 Encounter for general adult medical examination without abnormal findings: Secondary | ICD-10-CM | POA: Diagnosis not present

## 2023-10-08 DIAGNOSIS — K219 Gastro-esophageal reflux disease without esophagitis: Secondary | ICD-10-CM | POA: Diagnosis not present

## 2023-10-08 DIAGNOSIS — F209 Schizophrenia, unspecified: Secondary | ICD-10-CM | POA: Diagnosis not present

## 2023-10-08 DIAGNOSIS — E782 Mixed hyperlipidemia: Secondary | ICD-10-CM | POA: Diagnosis not present

## 2023-10-08 DIAGNOSIS — I10 Essential (primary) hypertension: Secondary | ICD-10-CM | POA: Diagnosis not present

## 2023-10-08 DIAGNOSIS — M5090 Cervical disc disorder, unspecified, unspecified cervical region: Secondary | ICD-10-CM | POA: Diagnosis not present

## 2023-10-08 DIAGNOSIS — Z23 Encounter for immunization: Secondary | ICD-10-CM | POA: Diagnosis not present

## 2023-11-14 DIAGNOSIS — K2940 Chronic atrophic gastritis without bleeding: Secondary | ICD-10-CM | POA: Diagnosis not present

## 2023-11-14 DIAGNOSIS — K31A14 Gastric intestinal metaplasia without dysplasia, involving the cardia: Secondary | ICD-10-CM | POA: Diagnosis not present

## 2023-11-14 DIAGNOSIS — K31A12 Gastric intestinal metaplasia without dysplasia, involving the body (corpus): Secondary | ICD-10-CM | POA: Diagnosis not present

## 2023-11-14 DIAGNOSIS — K31A19 Gastric intestinal metaplasia without dysplasia, unspecified site: Secondary | ICD-10-CM | POA: Diagnosis not present

## 2023-11-14 DIAGNOSIS — K2930 Chronic superficial gastritis without bleeding: Secondary | ICD-10-CM | POA: Diagnosis not present

## 2023-11-18 DIAGNOSIS — J3089 Other allergic rhinitis: Secondary | ICD-10-CM | POA: Diagnosis not present

## 2023-11-18 DIAGNOSIS — L509 Urticaria, unspecified: Secondary | ICD-10-CM | POA: Diagnosis not present

## 2023-11-18 DIAGNOSIS — H1045 Other chronic allergic conjunctivitis: Secondary | ICD-10-CM | POA: Diagnosis not present

## 2023-11-18 DIAGNOSIS — J3081 Allergic rhinitis due to animal (cat) (dog) hair and dander: Secondary | ICD-10-CM | POA: Diagnosis not present

## 2023-11-22 DIAGNOSIS — K2930 Chronic superficial gastritis without bleeding: Secondary | ICD-10-CM | POA: Diagnosis not present

## 2023-11-22 DIAGNOSIS — K2940 Chronic atrophic gastritis without bleeding: Secondary | ICD-10-CM | POA: Diagnosis not present

## 2023-11-22 DIAGNOSIS — K31A19 Gastric intestinal metaplasia without dysplasia, unspecified site: Secondary | ICD-10-CM | POA: Diagnosis not present

## 2023-12-09 DIAGNOSIS — H524 Presbyopia: Secondary | ICD-10-CM | POA: Diagnosis not present

## 2023-12-09 DIAGNOSIS — H25813 Combined forms of age-related cataract, bilateral: Secondary | ICD-10-CM | POA: Diagnosis not present

## 2023-12-09 DIAGNOSIS — H40023 Open angle with borderline findings, high risk, bilateral: Secondary | ICD-10-CM | POA: Diagnosis not present

## 2023-12-09 DIAGNOSIS — H5319 Other subjective visual disturbances: Secondary | ICD-10-CM | POA: Diagnosis not present

## 2023-12-09 DIAGNOSIS — H04123 Dry eye syndrome of bilateral lacrimal glands: Secondary | ICD-10-CM | POA: Diagnosis not present

## 2023-12-17 DIAGNOSIS — E039 Hypothyroidism, unspecified: Secondary | ICD-10-CM | POA: Diagnosis not present

## 2024-02-14 DIAGNOSIS — E039 Hypothyroidism, unspecified: Secondary | ICD-10-CM | POA: Diagnosis not present

## 2024-02-17 DIAGNOSIS — Z1231 Encounter for screening mammogram for malignant neoplasm of breast: Secondary | ICD-10-CM | POA: Diagnosis not present

## 2024-03-02 DIAGNOSIS — Z01419 Encounter for gynecological examination (general) (routine) without abnormal findings: Secondary | ICD-10-CM | POA: Diagnosis not present

## 2024-03-02 DIAGNOSIS — Z124 Encounter for screening for malignant neoplasm of cervix: Secondary | ICD-10-CM | POA: Diagnosis not present

## 2024-03-02 DIAGNOSIS — N814 Uterovaginal prolapse, unspecified: Secondary | ICD-10-CM | POA: Diagnosis not present

## 2024-03-02 DIAGNOSIS — N905 Atrophy of vulva: Secondary | ICD-10-CM | POA: Diagnosis not present

## 2024-04-13 DIAGNOSIS — K2950 Unspecified chronic gastritis without bleeding: Secondary | ICD-10-CM | POA: Diagnosis not present

## 2024-04-13 DIAGNOSIS — R7303 Prediabetes: Secondary | ICD-10-CM | POA: Diagnosis not present

## 2024-04-13 DIAGNOSIS — E039 Hypothyroidism, unspecified: Secondary | ICD-10-CM | POA: Diagnosis not present

## 2024-04-13 DIAGNOSIS — K760 Fatty (change of) liver, not elsewhere classified: Secondary | ICD-10-CM | POA: Diagnosis not present

## 2024-04-13 DIAGNOSIS — I10 Essential (primary) hypertension: Secondary | ICD-10-CM | POA: Diagnosis not present

## 2024-04-13 DIAGNOSIS — F209 Schizophrenia, unspecified: Secondary | ICD-10-CM | POA: Diagnosis not present

## 2024-04-13 DIAGNOSIS — E782 Mixed hyperlipidemia: Secondary | ICD-10-CM | POA: Diagnosis not present

## 2024-04-14 ENCOUNTER — Other Ambulatory Visit: Payer: Self-pay | Admitting: Gastroenterology

## 2024-04-14 DIAGNOSIS — Z800 Family history of malignant neoplasm of digestive organs: Secondary | ICD-10-CM

## 2024-04-15 DIAGNOSIS — F250 Schizoaffective disorder, bipolar type: Secondary | ICD-10-CM | POA: Diagnosis not present

## 2024-05-05 DIAGNOSIS — L299 Pruritus, unspecified: Secondary | ICD-10-CM | POA: Diagnosis not present

## 2024-05-05 DIAGNOSIS — N814 Uterovaginal prolapse, unspecified: Secondary | ICD-10-CM | POA: Diagnosis not present

## 2024-05-05 DIAGNOSIS — K760 Fatty (change of) liver, not elsewhere classified: Secondary | ICD-10-CM | POA: Diagnosis not present

## 2024-05-05 DIAGNOSIS — F325 Major depressive disorder, single episode, in full remission: Secondary | ICD-10-CM | POA: Diagnosis not present

## 2024-05-05 DIAGNOSIS — E039 Hypothyroidism, unspecified: Secondary | ICD-10-CM | POA: Diagnosis not present

## 2024-05-05 DIAGNOSIS — F259 Schizoaffective disorder, unspecified: Secondary | ICD-10-CM | POA: Diagnosis not present

## 2024-05-05 DIAGNOSIS — K219 Gastro-esophageal reflux disease without esophagitis: Secondary | ICD-10-CM | POA: Diagnosis not present

## 2024-05-05 DIAGNOSIS — E785 Hyperlipidemia, unspecified: Secondary | ICD-10-CM | POA: Diagnosis not present

## 2024-05-05 DIAGNOSIS — M8580 Other specified disorders of bone density and structure, unspecified site: Secondary | ICD-10-CM | POA: Diagnosis not present

## 2024-05-08 ENCOUNTER — Other Ambulatory Visit

## 2024-05-20 ENCOUNTER — Inpatient Hospital Stay: Admission: RE | Admit: 2024-05-20 | Discharge: 2024-05-20 | Attending: Gastroenterology

## 2024-05-20 DIAGNOSIS — Z800 Family history of malignant neoplasm of digestive organs: Secondary | ICD-10-CM

## 2024-05-20 MED ORDER — GADOPICLENOL 0.5 MMOL/ML IV SOLN
7.0000 mL | Freq: Once | INTRAVENOUS | Status: AC | PRN
  Administered 2024-05-20: 15:00:00 7 mL via INTRAVENOUS

## 2024-06-12 ENCOUNTER — Other Ambulatory Visit (HOSPITAL_COMMUNITY): Payer: Self-pay | Admitting: Obstetrics & Gynecology

## 2024-06-12 DIAGNOSIS — N950 Postmenopausal bleeding: Secondary | ICD-10-CM

## 2024-06-19 ENCOUNTER — Ambulatory Visit (HOSPITAL_COMMUNITY)
Admission: RE | Admit: 2024-06-19 | Discharge: 2024-06-19 | Disposition: A | Discharge status: Home / Self Care | Source: Ambulatory Visit | Attending: Obstetrics & Gynecology | Admitting: Obstetrics & Gynecology

## 2024-06-19 DIAGNOSIS — N950 Postmenopausal bleeding: Secondary | ICD-10-CM | POA: Insufficient documentation
